# Patient Record
Sex: Female | Born: 1960 | Race: White | Hispanic: No | Marital: Single | Smoking: Never smoker
Health system: Southern US, Community
[De-identification: ages and names within clinical notes are randomized; demographics above are authoritative.]

## PROBLEM LIST (undated history)

## (undated) DIAGNOSIS — D649 Anemia, unspecified: Secondary | ICD-10-CM

## (undated) DIAGNOSIS — F419 Anxiety disorder, unspecified: Secondary | ICD-10-CM

## (undated) DIAGNOSIS — J45909 Unspecified asthma, uncomplicated: Secondary | ICD-10-CM

## (undated) DIAGNOSIS — M1990 Unspecified osteoarthritis, unspecified site: Secondary | ICD-10-CM

## (undated) DIAGNOSIS — J189 Pneumonia, unspecified organism: Secondary | ICD-10-CM

## (undated) DIAGNOSIS — K219 Gastro-esophageal reflux disease without esophagitis: Secondary | ICD-10-CM

## (undated) HISTORY — PX: NASAL SINUS SURGERY: SHX719

## (undated) HISTORY — PX: FOOT SURGERY: SHX648

## (undated) HISTORY — PX: TONSILLECTOMY: SUR1361

## (undated) HISTORY — PX: OTHER SURGICAL HISTORY: SHX169

---

## 1998-06-07 ENCOUNTER — Ambulatory Visit (HOSPITAL_BASED_OUTPATIENT_CLINIC_OR_DEPARTMENT_OTHER): Admission: RE | Admit: 1998-06-07 | Discharge: 1998-06-07 | Payer: Self-pay | Admitting: *Deleted

## 1999-12-31 ENCOUNTER — Encounter: Payer: Self-pay | Admitting: Obstetrics and Gynecology

## 1999-12-31 ENCOUNTER — Encounter: Admission: RE | Admit: 1999-12-31 | Discharge: 1999-12-31 | Payer: Self-pay | Admitting: Obstetrics and Gynecology

## 2001-01-06 ENCOUNTER — Other Ambulatory Visit: Admission: RE | Admit: 2001-01-06 | Discharge: 2001-01-06 | Payer: Self-pay | Admitting: *Deleted

## 2001-12-30 ENCOUNTER — Encounter: Payer: Self-pay | Admitting: Obstetrics and Gynecology

## 2001-12-30 ENCOUNTER — Encounter: Admission: RE | Admit: 2001-12-30 | Discharge: 2001-12-30 | Payer: Self-pay | Admitting: Obstetrics and Gynecology

## 2002-02-21 ENCOUNTER — Other Ambulatory Visit: Admission: RE | Admit: 2002-02-21 | Discharge: 2002-02-21 | Payer: Self-pay | Admitting: Obstetrics and Gynecology

## 2002-02-27 ENCOUNTER — Encounter: Payer: Self-pay | Admitting: Family Medicine

## 2002-02-27 ENCOUNTER — Encounter: Admission: RE | Admit: 2002-02-27 | Discharge: 2002-02-27 | Payer: Self-pay | Admitting: Family Medicine

## 2002-11-16 ENCOUNTER — Encounter: Admission: RE | Admit: 2002-11-16 | Discharge: 2002-11-16 | Payer: Self-pay | Admitting: *Deleted

## 2002-11-16 ENCOUNTER — Encounter: Payer: Self-pay | Admitting: Allergy and Immunology

## 2003-02-26 ENCOUNTER — Other Ambulatory Visit: Admission: RE | Admit: 2003-02-26 | Discharge: 2003-02-26 | Payer: Self-pay | Admitting: Obstetrics and Gynecology

## 2003-02-26 ENCOUNTER — Ambulatory Visit (HOSPITAL_COMMUNITY): Admission: RE | Admit: 2003-02-26 | Discharge: 2003-02-26 | Payer: Self-pay | Admitting: Obstetrics and Gynecology

## 2003-02-26 ENCOUNTER — Encounter: Payer: Self-pay | Admitting: Obstetrics and Gynecology

## 2004-03-19 ENCOUNTER — Ambulatory Visit (HOSPITAL_COMMUNITY): Admission: RE | Admit: 2004-03-19 | Discharge: 2004-03-19 | Payer: Self-pay | Admitting: Obstetrics and Gynecology

## 2004-03-19 ENCOUNTER — Other Ambulatory Visit: Admission: RE | Admit: 2004-03-19 | Discharge: 2004-03-19 | Payer: Self-pay | Admitting: Obstetrics and Gynecology

## 2005-04-07 ENCOUNTER — Ambulatory Visit (HOSPITAL_COMMUNITY): Admission: RE | Admit: 2005-04-07 | Discharge: 2005-04-07 | Payer: Self-pay | Admitting: Obstetrics and Gynecology

## 2005-08-07 ENCOUNTER — Ambulatory Visit (HOSPITAL_COMMUNITY): Admission: RE | Admit: 2005-08-07 | Discharge: 2005-08-07 | Payer: Self-pay | Admitting: *Deleted

## 2005-08-07 ENCOUNTER — Encounter (INDEPENDENT_AMBULATORY_CARE_PROVIDER_SITE_OTHER): Payer: Self-pay | Admitting: Specialist

## 2006-04-13 ENCOUNTER — Ambulatory Visit (HOSPITAL_COMMUNITY): Admission: RE | Admit: 2006-04-13 | Discharge: 2006-04-13 | Payer: Self-pay | Admitting: Obstetrics and Gynecology

## 2006-05-21 ENCOUNTER — Encounter: Admission: RE | Admit: 2006-05-21 | Discharge: 2006-05-21 | Payer: Self-pay | Admitting: Obstetrics and Gynecology

## 2007-04-13 ENCOUNTER — Encounter: Admission: RE | Admit: 2007-04-13 | Discharge: 2007-04-13 | Payer: Self-pay | Admitting: Family Medicine

## 2007-05-23 ENCOUNTER — Ambulatory Visit (HOSPITAL_COMMUNITY): Admission: RE | Admit: 2007-05-23 | Discharge: 2007-05-23 | Payer: Self-pay | Admitting: Obstetrics and Gynecology

## 2008-05-28 ENCOUNTER — Ambulatory Visit (HOSPITAL_COMMUNITY): Admission: RE | Admit: 2008-05-28 | Discharge: 2008-05-28 | Payer: Self-pay | Admitting: Obstetrics and Gynecology

## 2009-06-03 ENCOUNTER — Ambulatory Visit (HOSPITAL_COMMUNITY): Admission: RE | Admit: 2009-06-03 | Discharge: 2009-06-03 | Payer: Self-pay | Admitting: Obstetrics and Gynecology

## 2010-03-05 ENCOUNTER — Encounter: Admission: RE | Admit: 2010-03-05 | Discharge: 2010-03-05 | Payer: Self-pay | Admitting: Family Medicine

## 2010-03-07 ENCOUNTER — Encounter: Admission: RE | Admit: 2010-03-07 | Discharge: 2010-03-07 | Payer: Self-pay | Admitting: Family Medicine

## 2010-06-25 ENCOUNTER — Ambulatory Visit (HOSPITAL_COMMUNITY): Admission: RE | Admit: 2010-06-25 | Discharge: 2010-06-25 | Payer: Self-pay | Admitting: Obstetrics and Gynecology

## 2010-11-16 ENCOUNTER — Encounter: Payer: Self-pay | Admitting: Family Medicine

## 2010-11-16 ENCOUNTER — Encounter: Payer: Self-pay | Admitting: Obstetrics and Gynecology

## 2011-03-13 NOTE — Op Note (Signed)
Yolanda Ochoa, Yolanda Ochoa                ACCOUNT NO.:  [id number]   MEDICAL RECORD NO.:  [id number]          PATIENT TYPE:  AMB   LOCATION:  DAY                          FACILITY:  [facility]   PHYSICIAN:  Vikki Ports, MDDATE OF BIRTH:  05-16-1961   DATE OF PROCEDURE:  08/07/2005  DATE OF DISCHARGE:                                 OPERATIVE REPORT   PREOPERATIVE DIAGNOSIS:  Upper back mass.   POSTOPERATIVE DIAGNOSIS:  Upper back mass, lipoma, 6 cm.   PROCEDURE:  Excision of upper back mass.   SURGEON:  Vikki Ports, MD   ANESTHESIA:  General.   DESCRIPTION OF PROCEDURE:  The patient was taken to the operating room,  placed in a supine position and after adequate general anesthesia was  induced using endotracheal tube, the patient was placed in the prone  position. The upper back was prepped and draped in a normal sterile fashion.  Using a vertical incision over the mass, I dissected down through the  subcutaneous tissue onto a well encapsulated lipoma. It was mobilized and  delivered from the wound. Adequate hemostasis was ensured and the skin was  closed with subcuticular 3-0 Monocryl. Steri-Strips and sterile dressings  were applied. The __________ were injected with 0.5 Marcaine, Steri-Strips  and sterile dressing was applied. The patient tolerated the procedure well  and went to PACU in good condition.      Vikki Ports, MD  Electronically Signed     [facility]/MEDQ  D:  08/07/2005  T:  08/07/2005  Job:  [id number]

## 2011-06-05 ENCOUNTER — Other Ambulatory Visit (HOSPITAL_COMMUNITY): Payer: Self-pay | Admitting: Obstetrics and Gynecology

## 2011-06-05 DIAGNOSIS — Z1231 Encounter for screening mammogram for malignant neoplasm of breast: Secondary | ICD-10-CM

## 2011-06-30 ENCOUNTER — Ambulatory Visit (HOSPITAL_COMMUNITY): Payer: Self-pay

## 2011-07-03 ENCOUNTER — Ambulatory Visit (HOSPITAL_COMMUNITY)
Admission: RE | Admit: 2011-07-03 | Discharge: 2011-07-03 | Disposition: A | Discharge status: Home / Self Care | Payer: BC Managed Care – PPO | Source: Ambulatory Visit | Attending: Obstetrics and Gynecology | Admitting: Obstetrics and Gynecology

## 2011-07-03 DIAGNOSIS — Z1231 Encounter for screening mammogram for malignant neoplasm of breast: Secondary | ICD-10-CM | POA: Insufficient documentation

## 2012-05-17 ENCOUNTER — Other Ambulatory Visit: Payer: Self-pay | Admitting: Gastroenterology

## 2012-07-14 ENCOUNTER — Other Ambulatory Visit (HOSPITAL_COMMUNITY): Payer: Self-pay | Admitting: Obstetrics and Gynecology

## 2012-07-14 DIAGNOSIS — Z1231 Encounter for screening mammogram for malignant neoplasm of breast: Secondary | ICD-10-CM

## 2012-08-09 ENCOUNTER — Ambulatory Visit (HOSPITAL_COMMUNITY)
Admission: RE | Admit: 2012-08-09 | Discharge: 2012-08-09 | Disposition: A | Discharge status: Home / Self Care | Payer: 59 | Source: Ambulatory Visit | Attending: Obstetrics and Gynecology | Admitting: Obstetrics and Gynecology

## 2012-08-09 DIAGNOSIS — Z1231 Encounter for screening mammogram for malignant neoplasm of breast: Secondary | ICD-10-CM | POA: Insufficient documentation

## 2012-08-12 ENCOUNTER — Ambulatory Visit (HOSPITAL_COMMUNITY): Payer: BC Managed Care – PPO

## 2013-07-27 ENCOUNTER — Other Ambulatory Visit (HOSPITAL_COMMUNITY): Payer: Self-pay | Admitting: Obstetrics and Gynecology

## 2013-07-27 DIAGNOSIS — Z1231 Encounter for screening mammogram for malignant neoplasm of breast: Secondary | ICD-10-CM

## 2013-08-21 ENCOUNTER — Ambulatory Visit (HOSPITAL_COMMUNITY)
Admission: RE | Admit: 2013-08-21 | Discharge: 2013-08-21 | Disposition: A | Discharge status: Home / Self Care | Payer: BC Managed Care – PPO | Source: Ambulatory Visit | Attending: Obstetrics and Gynecology | Admitting: Obstetrics and Gynecology

## 2013-08-21 DIAGNOSIS — Z1231 Encounter for screening mammogram for malignant neoplasm of breast: Secondary | ICD-10-CM | POA: Insufficient documentation

## 2014-08-29 ENCOUNTER — Other Ambulatory Visit (HOSPITAL_COMMUNITY): Payer: Self-pay | Admitting: Obstetrics and Gynecology

## 2014-08-29 DIAGNOSIS — Z1231 Encounter for screening mammogram for malignant neoplasm of breast: Secondary | ICD-10-CM

## 2014-09-04 ENCOUNTER — Ambulatory Visit (HOSPITAL_COMMUNITY): Payer: 59

## 2016-04-16 DIAGNOSIS — F411 Generalized anxiety disorder: Secondary | ICD-10-CM | POA: Diagnosis not present

## 2016-04-16 DIAGNOSIS — J4520 Mild intermittent asthma, uncomplicated: Secondary | ICD-10-CM | POA: Diagnosis not present

## 2016-04-16 DIAGNOSIS — E6609 Other obesity due to excess calories: Secondary | ICD-10-CM | POA: Diagnosis not present

## 2016-05-19 DIAGNOSIS — E6609 Other obesity due to excess calories: Secondary | ICD-10-CM | POA: Diagnosis not present

## 2016-06-18 DIAGNOSIS — E6609 Other obesity due to excess calories: Secondary | ICD-10-CM | POA: Diagnosis not present

## 2016-11-05 DIAGNOSIS — Z0000 Encounter for general adult medical examination without abnormal findings: Secondary | ICD-10-CM | POA: Diagnosis not present

## 2016-11-19 DIAGNOSIS — J4520 Mild intermittent asthma, uncomplicated: Secondary | ICD-10-CM | POA: Diagnosis not present

## 2016-11-19 DIAGNOSIS — M1612 Unilateral primary osteoarthritis, left hip: Secondary | ICD-10-CM | POA: Diagnosis not present

## 2016-11-19 DIAGNOSIS — F411 Generalized anxiety disorder: Secondary | ICD-10-CM | POA: Diagnosis not present

## 2016-11-19 DIAGNOSIS — M25552 Pain in left hip: Secondary | ICD-10-CM | POA: Diagnosis not present

## 2016-11-19 DIAGNOSIS — E6609 Other obesity due to excess calories: Secondary | ICD-10-CM | POA: Diagnosis not present

## 2016-11-19 DIAGNOSIS — Z0000 Encounter for general adult medical examination without abnormal findings: Secondary | ICD-10-CM | POA: Diagnosis not present

## 2017-01-04 ENCOUNTER — Ambulatory Visit (INDEPENDENT_AMBULATORY_CARE_PROVIDER_SITE_OTHER): Payer: Self-pay | Admitting: Orthopaedic Surgery

## 2017-01-20 ENCOUNTER — Ambulatory Visit (INDEPENDENT_AMBULATORY_CARE_PROVIDER_SITE_OTHER): Payer: BLUE CROSS/BLUE SHIELD | Admitting: Physician Assistant

## 2017-01-20 ENCOUNTER — Ambulatory Visit (INDEPENDENT_AMBULATORY_CARE_PROVIDER_SITE_OTHER): Payer: BLUE CROSS/BLUE SHIELD

## 2017-01-20 DIAGNOSIS — M25511 Pain in right shoulder: Secondary | ICD-10-CM

## 2017-01-20 DIAGNOSIS — M1612 Unilateral primary osteoarthritis, left hip: Secondary | ICD-10-CM

## 2017-01-20 DIAGNOSIS — M542 Cervicalgia: Secondary | ICD-10-CM | POA: Diagnosis not present

## 2017-01-20 MED ORDER — MELOXICAM 7.5 MG PO TABS: 7.5000 mg | ORAL_TABLET | Freq: Two times a day (BID) | ORAL | 1 refills | Status: DC

## 2017-01-20 NOTE — Progress Notes (Signed)
Office Visit Note   Patient: Yolanda Ochoa           Date of Birth: 1960-10-27           MRN: [id number] Visit Date: 01/20/2017              Requested by: Georgianne Fick, MD [address] [place], [place] [address] PCP: Georgianne Fick, MD   Assessment & Plan: Visit Diagnoses:  1. Acute pain of right shoulder   2. Cervical pain (neck)   3. Primary osteoarthritis of left hip     Plan: We'll have her undergo a intra-articular injection of her left hip with Dr. Alvester Morin in the near future. Regards to the neck will send her to physical therapy for modalities range of motion strengthening home exercise program. Offered her a muscle relaxant she defers. 0.5 mg 1 by mouth twice a day no other NSAIDs while on this. We'll see her back in 1 month check progress lack of.  Follow-Up Instructions: Return in about 4 weeks (around 02/17/2017).   Orders:  Orders Placed This Encounter  Procedures  . XR Shoulder Right  . XR Cervical Spine 2 or 3 views   Meds ordered this encounter  Medications  . meloxicam (MOBIC) 7.5 MG tablet    Sig: Take 1 tablet (7.5 mg total) by mouth 2 (two) times daily.    Dispense:  60 tablet    Refill:  1      Procedures: No procedures performed   Clinical Data: No additional findings.   Subjective: No chief complaint on file.   HPI Ms. Yolanda D9-year-old female with hip right shoulder pain. States she's had left hip for years worse over the past 2 years. She reports that she is done dancing particular ballet life and yoga. No actual injury. She denies any real groin pain. Pains despite around the lateral aspect of the hip and buttocks region. No radicular symptoms down the left leg. She's tried Advil with no real relief. She has pain with prolonged walking. 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 Does not use any assistive devices to ambulate.  Right shoulder pain  for 1 year on and off. Pain with range of motion of her neck. Pain interscapular occasionally has some numbness down the right arm and at times into the right thumb but does not involve the remainder of the fingers. His no symptoms down the left arm. No injury to her neck or to her shoulder.Does report that she's doing a lot more keying  now in her job and is looking at a computer a lot. AP pelvis and lateral view of the left hip are reviewed on canopy show severe end-stage arthritis of the left hip. Flattening of the femoral head and sclerotic changes acetabulum with osteophytes off the acetabulum.  Review of Systems  Constitutional: Negative for chills, fever and unexpected weight change.  Eyes: Negative.   Respiratory: Negative for shortness of breath.   Endocrine: Negative for polydipsia and polyuria.  Musculoskeletal: Positive for back pain, joint swelling and neck pain.  Neurological: Positive for numbness. Negative for dizziness and light-headedness.     Objective: Vital Signs: There were no vitals taken for this visit.  Physical Exam  Constitutional: She is oriented to person, place, and time. She appears well-developed and well-nourished. No distress.  Eyes: EOM are normal.  Cardiovascular: Intact distal pulses.   Pulmonary/Chest: Effort  normal.  Neurological: She is alert and oriented to person, place, and time.  Psychiatric: She has a normal mood and affect. Her behavior is normal.    Ortho Exam Bilateral shoulder she has 5 out of 5 strength with external and internal rotation against resistance. Negative impingement testing bilaterally. Empty can test negative bilaterally. Cross over test is negative bilaterally. Upper chest or she has full sensation to light touch. Radial pulses are 2+ bilaterally full motor of both hands. Cervical spine she has some slight decreased range of motion with rotation of the cervical spine of the right and left. Negative Spurling's. Good flexion and  extension cervical by no tenderness over the cervical spine tenderness in the trapezius region on the right and also over the right medial border of the scapula.  Lower extremities she has diminished internal rotation of the left hip and pain with internal and external rotation of left hip good range of motion of the right hip without pain. Posterior tibial pulses are 2+ bilaterally Supple and Nontender Bilaterally . Specialty Comments:  No specialty comments available.  Imaging: Xr Cervical Spine 2 Or 3 Views  Result Date: 01/20/2017 AP lateral views cervical spine: No acute fractures. No spondylolisthesis. Loss of lordotic curvature. The plate spurring throughout with almost complete bridging at C5-C6 and C6-C7.  Xr Shoulder Right  Result Date: 01/20/2017 Right shoulder 3 views: Shoulder is well located. Minimal acromioclavicular joint arthritic changes. Glenohumeral joint is well maintained. Subacromial space well-maintained.    PMFS History: There are no active problems to display for this patient.  No past medical history on file.  No family history on file.  No past surgical history on file. Social History   Occupational History  . Not on file.   Social History Main Topics  . Smoking status: Not on file  . Smokeless tobacco: Not on file  . Alcohol use Not on file  . Drug use: Unknown  . Sexual activity: Not on file

## 2017-01-27 ENCOUNTER — Ambulatory Visit (INDEPENDENT_AMBULATORY_CARE_PROVIDER_SITE_OTHER): Payer: BLUE CROSS/BLUE SHIELD | Admitting: Physical Medicine and Rehabilitation

## 2017-01-27 ENCOUNTER — Ambulatory Visit (INDEPENDENT_AMBULATORY_CARE_PROVIDER_SITE_OTHER): Payer: BLUE CROSS/BLUE SHIELD

## 2017-01-27 ENCOUNTER — Encounter (INDEPENDENT_AMBULATORY_CARE_PROVIDER_SITE_OTHER): Payer: Self-pay | Admitting: Physical Medicine and Rehabilitation

## 2017-01-27 VITALS — BP 127/80

## 2017-01-27 DIAGNOSIS — M25552 Pain in left hip: Secondary | ICD-10-CM | POA: Diagnosis not present

## 2017-01-27 NOTE — Patient Instructions (Signed)

## 2017-01-27 NOTE — Progress Notes (Signed)
   Yolanda Ochoa - 56 y.o. female MRN [id number]  Date of birth: February 15, 1961  Office Visit Note: Visit Date: 01/27/2017 PCP: Georgianne Fick, MD Referred by: Georgianne Fick, MD  Subjective: Chief Complaint  Patient presents with  . Left Hip - Pain   HPI: Yolanda Ochoa is a pleasant and active 56 year old female with chronic worsening severe left hip pain for 2 years. Constant pain. Worse with walking and twisting. Denies groin pain but does get pain into the superior anterior lateral area of the hip particularly with rotation of the hip.    ROS Otherwise per HPI.  Assessment & Plan: Visit Diagnoses:  1. Pain in left hip     Plan: Findings:  Left hip intra-articular joint anesthetic arthrogram. She did get good relief during the anesthetic phase.    Meds & Orders: No orders of the defined types were placed in this encounter.   Orders Placed This Encounter  Procedures  . Large Joint Injection/Arthrocentesis  . XR C-ARM NO REPORT    Follow-up: Return for Dr. Magnus Ivan scheduled follow up.   Procedures: Hip anesthetic arthrogram Date/Time: 01/27/2017 9:57 AM Performed by: Tyrell Antonio Authorized by: Tyrell Antonio   Consent Given by:  Patient Site marked: the procedure site was marked   Timeout: prior to procedure the correct patient, procedure, and site was verified   Indications:  Pain and diagnostic evaluation Location:  Hip Site:  L hip joint Prep: patient was prepped and draped in usual sterile fashion   Needle Size:  22 G Approach:  Anterior Ultrasound Guidance: No   Fluoroscopic Guidance: No   Arthrogram: Yes   Medications:  80 mg triamcinolone acetonide 40 MG/ML; 3 mL bupivacaine 0.5 % Aspiration Attempted: Yes   Patient tolerance:  Patient tolerated the procedure well with no immediate complications  Arthrogram demonstrated excellent flow of contrast throughout the joint surface without extravasation or obvious defect.  The patient had relief of  symptoms during the anesthetic phase of the injection.      No notes on file   Clinical History: No specialty comments available.  She reports that she has never smoked. She has never used smokeless tobacco. No results for input(s): HGBA1C, LABURIC in the last 8760 hours.  Objective:  VS:  HT:    WT:   BMI:     BP:127/80  HR: bpm  TEMP: ( )  RESP:  Physical Exam  Musculoskeletal:  The patient is slow to rise from a seated position and she does ambulate without aid. She does have concordant pain with hip rotation on the left.    Ortho Exam Imaging: Xr C-arm No Report  Result Date: 01/27/2017 Please see Notes or Procedures tab for imaging impression.   Past Medical/Family/Surgical/Social History: Medications & Allergies reviewed per EMR There are no active problems to display for this patient.  History reviewed. No pertinent past medical history. History reviewed. No pertinent family history. History reviewed. No pertinent surgical history. Social History   Occupational History  . Not on file.   Social History Main Topics  . Smoking status: Never Smoker  . Smokeless tobacco: Never Used  . Alcohol use Not on file  . Drug use: Unknown  . Sexual activity: Not on file

## 2017-01-28 MED ORDER — BUPIVACAINE HCL 0.5 % IJ SOLN
3.0000 mL | INTRAMUSCULAR | Status: AC | PRN
  Administered 2017-01-27: 3 mL via INTRA_ARTICULAR

## 2017-01-28 MED ORDER — TRIAMCINOLONE ACETONIDE 40 MG/ML IJ SUSP
80.0000 mg | INTRAMUSCULAR | Status: AC | PRN
  Administered 2017-01-27: 80 mg via INTRA_ARTICULAR

## 2017-02-02 DIAGNOSIS — M25511 Pain in right shoulder: Secondary | ICD-10-CM | POA: Diagnosis not present

## 2017-02-02 DIAGNOSIS — M6281 Muscle weakness (generalized): Secondary | ICD-10-CM | POA: Diagnosis not present

## 2017-02-02 DIAGNOSIS — R293 Abnormal posture: Secondary | ICD-10-CM | POA: Diagnosis not present

## 2017-02-02 DIAGNOSIS — M542 Cervicalgia: Secondary | ICD-10-CM | POA: Diagnosis not present

## 2017-02-15 DIAGNOSIS — M25511 Pain in right shoulder: Secondary | ICD-10-CM | POA: Diagnosis not present

## 2017-02-15 DIAGNOSIS — M542 Cervicalgia: Secondary | ICD-10-CM | POA: Diagnosis not present

## 2017-02-15 DIAGNOSIS — M6281 Muscle weakness (generalized): Secondary | ICD-10-CM | POA: Diagnosis not present

## 2017-02-15 DIAGNOSIS — R293 Abnormal posture: Secondary | ICD-10-CM | POA: Diagnosis not present

## 2017-02-22 ENCOUNTER — Encounter (INDEPENDENT_AMBULATORY_CARE_PROVIDER_SITE_OTHER): Payer: Self-pay

## 2017-02-22 ENCOUNTER — Ambulatory Visit (INDEPENDENT_AMBULATORY_CARE_PROVIDER_SITE_OTHER): Payer: BLUE CROSS/BLUE SHIELD | Admitting: Orthopaedic Surgery

## 2017-02-22 DIAGNOSIS — M1612 Unilateral primary osteoarthritis, left hip: Secondary | ICD-10-CM | POA: Diagnosis not present

## 2017-02-22 DIAGNOSIS — M25552 Pain in left hip: Secondary | ICD-10-CM | POA: Diagnosis not present

## 2017-02-22 NOTE — Progress Notes (Signed)
The patient is mainly coming in today for follow-up of her left hip. She has known osteoarthritis in that left hip which is quite severe. This correlates with her physical exam and her x-rays. Dr. Alvester Morin here in the office provided a fluoroscopically guided intra-articular injection of a steroid in that left hip earlier this month. She said that hip injection helped greatly. She is on meloxicam as well asTumeric. She says is taken the edge off enough that is helping her do her daily activities and her job. She does want to consider hip replacement surgery in the future but like to even consider one more steroid injection down the road.  On examination of her hip she has pain on extremes of internal rotation rotation of her left hip but tolerating it okay. Her right shoulder and neck has improved as well.  At this point if he gets close to considering another steroid injection which I told her to wait at least 4-5 months she can call the office and we would get Dr. Alvester Morin to provide again her left hip.

## 2017-02-23 DIAGNOSIS — M6281 Muscle weakness (generalized): Secondary | ICD-10-CM | POA: Diagnosis not present

## 2017-02-23 DIAGNOSIS — R293 Abnormal posture: Secondary | ICD-10-CM | POA: Diagnosis not present

## 2017-02-23 DIAGNOSIS — M542 Cervicalgia: Secondary | ICD-10-CM | POA: Diagnosis not present

## 2017-02-23 DIAGNOSIS — M25511 Pain in right shoulder: Secondary | ICD-10-CM | POA: Diagnosis not present

## 2017-05-01 ENCOUNTER — Other Ambulatory Visit (INDEPENDENT_AMBULATORY_CARE_PROVIDER_SITE_OTHER): Payer: Self-pay | Admitting: Orthopaedic Surgery

## 2017-06-17 DIAGNOSIS — J4520 Mild intermittent asthma, uncomplicated: Secondary | ICD-10-CM | POA: Diagnosis not present

## 2017-06-17 DIAGNOSIS — F411 Generalized anxiety disorder: Secondary | ICD-10-CM | POA: Diagnosis not present

## 2017-06-17 DIAGNOSIS — M25551 Pain in right hip: Secondary | ICD-10-CM | POA: Diagnosis not present

## 2017-07-05 ENCOUNTER — Encounter (INDEPENDENT_AMBULATORY_CARE_PROVIDER_SITE_OTHER): Payer: Self-pay | Admitting: Physician Assistant

## 2017-07-05 ENCOUNTER — Ambulatory Visit (INDEPENDENT_AMBULATORY_CARE_PROVIDER_SITE_OTHER): Payer: BLUE CROSS/BLUE SHIELD

## 2017-07-05 ENCOUNTER — Ambulatory Visit (INDEPENDENT_AMBULATORY_CARE_PROVIDER_SITE_OTHER): Payer: BLUE CROSS/BLUE SHIELD | Admitting: Physician Assistant

## 2017-07-05 DIAGNOSIS — M1612 Unilateral primary osteoarthritis, left hip: Secondary | ICD-10-CM | POA: Diagnosis not present

## 2017-07-05 DIAGNOSIS — M1611 Unilateral primary osteoarthritis, right hip: Secondary | ICD-10-CM

## 2017-07-05 MED ORDER — TRIAMCINOLONE ACETONIDE 40 MG/ML IJ SUSP
80.0000 mg | INTRAMUSCULAR | Status: AC | PRN
  Administered 2017-07-05: 80 mg via INTRA_ARTICULAR

## 2017-07-05 MED ORDER — BUPIVACAINE HCL 0.5 % IJ SOLN
3.0000 mL | INTRAMUSCULAR | Status: AC | PRN
Start: 2017-07-05 — End: 2017-07-05
  Administered 2017-07-05: 3 mL via INTRA_ARTICULAR

## 2017-07-05 MED ORDER — TRAMADOL HCL 50 MG PO TABS: ORAL_TABLET | ORAL | 0 refills | Status: DC

## 2017-07-05 NOTE — Progress Notes (Signed)
Office Visit Note   Patient: Yolanda Ochoa           Date of Birth: 09/24/1961           MRN: [id number] Visit Date: 07/05/2017              Requested by: Georgianne Fickamachandran, Ajith, MD [address] [place], [place] [id number] PCP: Georgianne Fickamachandran, Ajith, MD   Assessment & Plan: Visit Diagnoses:  1. Primary osteoarthritis of right hip   2. Primary osteoarthritis of left hip     Plan: We will schedule her for a left total hip arthroplasty in the near future. She's failed conservative treatment which is included time, oral medications and intra-articular injections of the left hip. Despite these she has severe pain in her hip that affects her activities of daily living. Discussed with her risks which include nerve or vessel injury, wound healing problems, PE DVT, prolonged pain and worsening pain. Questions encouraged and answered at length today. Handout on hip replacements as given. A hip model was shown that the patient discussed at length. Postoperative protocol discussed with patient. Also discussed with her a right hip intra-articular injection which we will have her undergo today with Dr. Alvester MorinNewton.  Follow-Up Instructions: Return in about 2 weeks (around 07/19/2017) for post op 2 weeks.   Orders:  Orders Placed This Encounter  Procedures  . Large Joint Injection/Arthrocentesis  . XR HIP UNILAT W OR W/O PELVIS 2-3 VIEWS RIGHT  . XR C-ARM NO REPORT   Meds ordered this encounter  Medications  . traMADol (ULTRAM) 50 MG tablet    Sig: Take one tablet every 6 hours as needed for pain, not to exceed 4 a day.    Dispense:  40 tablet    Refill:  0      Procedures: Large Joint Inj Date/Time: 07/05/2017 11:17 AM Performed by: Tyrell AntonioNEWTON, FREDERIC Authorized by: Tyrell AntonioNEWTON, FREDERIC   Consent Given by:  Patient Site marked: the procedure site was marked   Timeout: prior to procedure the correct patient, procedure, and site was verified   Indications:  Pain and diagnostic  evaluation Location:  Hip Site:  R hip joint Prep: patient was prepped and draped in usual sterile fashion   Needle Size:  22 G Needle Length:  3.5 inches Approach:  Anterior Ultrasound Guidance: No   Fluoroscopic Guidance: Yes   Arthrogram: No   Medications:  80 mg triamcinolone acetonide 40 MG/ML; 3 mL bupivacaine 0.5 % Aspiration Attempted: Yes   Patient tolerance:  Patient tolerated the procedure well with no immediate complications  There was excellent flow of contrast producing a partial arthrogram of the hip. The patient did have relief of symptoms during the anesthetic phase of the injection.     Clinical Data: No additional findings.   Subjective: Chief Complaint  Patient presents with  . Right Hip - Pain    HPI Mrs. Vial was in today with bilateral hip pain right greater than left. She's had an intra-articular injections the left hip in the past but can tell that her left hip pain is coming back. Currently her right hips bothering her most. She says about a intra-articular injection in the right hip. She has osteoarthritis of both hips. She does take Mobic and Tumeric or hip pain. She does use a cane to ambulate at times. She states that her pain is becoming more more severe in both hips. She is asking about a left hip replacement near future. Said no history  of PE DVT.  Review of Systems No chest pain shortness breath fevers chills nausea vomiting.  Objective: Vital Signs: There were no vitals taken for this visit.  Physical Exam  Constitutional: She is oriented to person, place, and time. She appears well-developed and well-nourished. No distress.  Pulmonary/Chest: Effort normal.  Neurological: She is alert and oriented to person, place, and time.  Skin: She is not diaphoretic.  Psychiatric: She has a normal mood and affect.    Ortho Exam Bilateral hip she has good external rotation. Limited internal rotation with pain of both hips right greater than left  currently. Specialty Comments:  No specialty comments available.  Imaging: Xr Hip Unilat W Or W/o Pelvis 2-3 Views Right  Result Date: 07/05/2017 AP pelvis and bilateral lateral hips: No acute fracture. Hips are well located. Left hip with bone-on-bone arthritis in the spurring about the femoral head. Right hip with near bone-on-bone arthritis.    PMFS History: Patient Active Problem List   Diagnosis Date Noted  . Unilateral primary osteoarthritis, left hip 02/22/2017  . Pain of left hip joint 02/22/2017   No past medical history on file.  No family history on file.  No past surgical history on file. Social History   Occupational History  . Not on file.   Social History Main Topics  . Smoking status: Never Smoker  . Smokeless tobacco: Never Used  . Alcohol use Not on file  . Drug use: Unknown  . Sexual activity: Not on file

## 2017-07-05 NOTE — Patient Instructions (Signed)

## 2017-07-06 ENCOUNTER — Telehealth (INDEPENDENT_AMBULATORY_CARE_PROVIDER_SITE_OTHER): Payer: Self-pay | Admitting: Radiology

## 2017-07-06 NOTE — Telephone Encounter (Signed)
Patient left voicemail advising she wants to change her surgery date to October 08, 2017.  She is scheduled for October 15, 2017 at this moment.

## 2017-07-08 NOTE — Telephone Encounter (Signed)
I called patient and rescheduled.

## 2017-07-14 ENCOUNTER — Telehealth (INDEPENDENT_AMBULATORY_CARE_PROVIDER_SITE_OTHER): Payer: Self-pay | Admitting: Orthopaedic Surgery

## 2017-07-14 NOTE — Telephone Encounter (Signed)
Please advise 

## 2017-07-14 NOTE — Telephone Encounter (Signed)
Pt wants to go ahead and schedule surgery Rt Hip

## 2017-07-14 NOTE — Telephone Encounter (Signed)
I will fill out a surgical scheduling sheet and get it to [facility].

## 2017-07-14 NOTE — Telephone Encounter (Signed)
To you 

## 2017-07-26 ENCOUNTER — Encounter (INDEPENDENT_AMBULATORY_CARE_PROVIDER_SITE_OTHER): Payer: BLUE CROSS/BLUE SHIELD | Admitting: Physical Medicine and Rehabilitation

## 2017-07-27 NOTE — Progress Notes (Signed)
Please place orders in EPIC as patient is being scheduled for a pre-op appointment! Thank you! 

## 2017-07-27 NOTE — Telephone Encounter (Signed)
I spoke with patient and scheduled surgery for right THA.

## 2017-07-28 ENCOUNTER — Telehealth (INDEPENDENT_AMBULATORY_CARE_PROVIDER_SITE_OTHER): Payer: Self-pay | Admitting: Physician Assistant

## 2017-07-28 ENCOUNTER — Other Ambulatory Visit (INDEPENDENT_AMBULATORY_CARE_PROVIDER_SITE_OTHER): Payer: Self-pay | Admitting: Physician Assistant

## 2017-07-28 ENCOUNTER — Other Ambulatory Visit (INDEPENDENT_AMBULATORY_CARE_PROVIDER_SITE_OTHER): Payer: Self-pay

## 2017-07-28 MED ORDER — TRAMADOL HCL 50 MG PO TABS: ORAL_TABLET | ORAL | 0 refills | Status: DC

## 2017-07-28 NOTE — Telephone Encounter (Signed)
Patient called needing Rx refilled (Tramadol) Patient asked if the Rx can be called into the [facility] in [place] on [address]   The ph# is [phone]   The number to contact patient is [phone] Patient advised she is out of town taking care of her father.

## 2017-07-28 NOTE — Telephone Encounter (Signed)
Please advise 

## 2017-07-28 NOTE — Telephone Encounter (Signed)
It is okay to call the tramadol for her. 1-2 every 6-8 hours as needed #60 no refills.

## 2017-07-28 NOTE — Telephone Encounter (Signed)
Called into pharmacy

## 2017-08-01 ENCOUNTER — Other Ambulatory Visit (INDEPENDENT_AMBULATORY_CARE_PROVIDER_SITE_OTHER): Payer: Self-pay | Admitting: Orthopaedic Surgery

## 2017-08-02 NOTE — Patient Instructions (Addendum)
GISSEL KEILMAN  08/02/2017   Your procedure is scheduled on: 08/13/17  Report to [facility] Main  Entrance   Report to admitting at  1000 AM   Call this number if you have problems the morning of surgery  [phone]   Remember: ONLY 1 PERSON MAY GO WITH YOU TO SHORT STAY TO GET  READY MORNING OF YOUR SURGERY.  Do not eat food or drink liquids :After Midnight.     Take these medicines the morning of surgery with A SIP OF WATER: inhalers as needed and bring, flonase if              needed, wellbutrin                                You may not have any metal on your body including hair pins and              piercings  Do not wear jewelry, make-up, lotions, powders or perfumes, deodorant             Do not wear nail polish.  Do not shave  48 hours prior to surgery.             Do not bring valuables to the hospital. [facility] IS NOT             RESPONSIBLE   FOR VALUABLES.  Contacts, dentures or bridgework may not be worn into surgery.  Leave suitcase in the car. After surgery it may be brought to your room.               Please read over the following fact sheets you were given: _____________________________________________________________________         [facility] - Preparing for Surgery Before surgery, you can play an important role.  Because skin is not sterile, your skin needs to be as free of germs as possible.  You can reduce the number of germs on your skin by washing with CHG (chlorahexidine gluconate) soap before surgery.  CHG is an antiseptic cleaner which kills germs and bonds with the skin to continue killing germs even after washing. Please DO NOT use if you have an allergy to CHG or antibacterial soaps.  If your skin becomes reddened/irritated stop using the CHG and inform your nurse when you arrive at Short Stay. Do not shave (including legs and underarms) for at least 48 hours prior to the first CHG shower.  You may shave your  face/neck. Please follow these instructions carefully:  1.  Shower with CHG Soap the night before surgery and the  morning of Surgery.  2.  If you choose to wash your hair, wash your hair first as usual with your  normal  shampoo.  3.  After you shampoo, rinse your hair and body thoroughly to remove the  shampoo.                           4.  Use CHG as you would any other liquid soap.  You can apply chg directly  to the skin and wash                       Gently with a scrungie or clean washcloth.  5.  Apply the  CHG Soap to your body ONLY FROM THE NECK DOWN.   Do not use on face/ open                           Wound or open sores. Avoid contact with eyes, ears mouth and genitals (private parts).                       Wash face,  Genitals (private parts) with your normal soap.             6.  Wash thoroughly, paying special attention to the area where your surgery  will be performed.  7.  Thoroughly rinse your body with warm water from the neck down.  8.  DO NOT shower/wash with your normal soap after using and rinsing off  the CHG Soap.                9.  Pat yourself dry with a clean towel.            10.  Wear clean pajamas.            11.  Place clean sheets on your bed the night of your first shower and do not  sleep with pets. Day of Surgery : Do not apply any lotions/deodorants the morning of surgery.  Please wear clean clothes to the hospital/surgery center.  FAILURE TO FOLLOW THESE INSTRUCTIONS MAY RESULT IN THE CANCELLATION OF YOUR SURGERY PATIENT SIGNATURE_________________________________  NURSE SIGNATURE__________________________________  ________________________________________________________________________  WHAT IS A BLOOD TRANSFUSION? Blood Transfusion Information  A transfusion is the replacement of blood or some of its parts. Blood is made up of multiple cells which provide different functions.  Red blood cells carry oxygen and are used for blood loss  replacement.  White blood cells fight against infection.  Platelets control bleeding.  Plasma helps clot blood.  Other blood products are available for specialized needs, such as hemophilia or other clotting disorders. BEFORE THE TRANSFUSION  Who gives blood for transfusions?   Healthy volunteers who are fully evaluated to make sure their blood is safe. This is blood bank blood. Transfusion therapy is the safest it has ever been in the practice of medicine. Before blood is taken from a donor, a complete history is taken to make sure that person has no history of diseases nor engages in risky social behavior (examples are intravenous drug use or sexual activity with multiple partners). The donor's travel history is screened to minimize risk of transmitting infections, such as malaria. The donated blood is tested for signs of infectious diseases, such as HIV and hepatitis. The blood is then tested to be sure it is compatible with you in order to minimize the chance of a transfusion reaction. If you or a relative donates blood, this is often done in anticipation of surgery and is not appropriate for emergency situations. It takes many days to process the donated blood. RISKS AND COMPLICATIONS Although transfusion therapy is very safe and saves many lives, the main dangers of transfusion include:   Getting an infectious disease.  Developing a transfusion reaction. This is an allergic reaction to something in the blood you were given. Every precaution is taken to prevent this. The decision to have a blood transfusion has been considered carefully by your caregiver before blood is given. Blood is not given unless the benefits outweigh the risks. AFTER THE TRANSFUSION  Right after receiving a blood transfusion,  you will usually feel much better and more energetic. This is especially true if your red blood cells have gotten low (anemic). The transfusion raises the level of the red blood cells which  carry oxygen, and this usually causes an energy increase.  The nurse administering the transfusion will monitor you carefully for complications. HOME CARE INSTRUCTIONS  No special instructions are needed after a transfusion. You may find your energy is better. Speak with your caregiver about any limitations on activity for underlying diseases you may have. SEEK MEDICAL CARE IF:   Your condition is not improving after your transfusion.  You develop redness or irritation at the intravenous (IV) site. SEEK IMMEDIATE MEDICAL CARE IF:  Any of the following symptoms occur over the next 12 hours:  Shaking chills.  You have a temperature by mouth above 102 F (38.9 C), not controlled by medicine.  Chest, back, or muscle pain.  People around you feel you are not acting correctly or are confused.  Shortness of breath or difficulty breathing.  Dizziness and fainting.  You get a rash or develop hives.  You have a decrease in urine output.  Your urine turns a dark color or changes to pink, red, or brown. Any of the following symptoms occur over the next 10 days:  You have a temperature by mouth above 102 F (38.9 C), not controlled by medicine.  Shortness of breath.  Weakness after normal activity.  The white part of the eye turns yellow (jaundice).  You have a decrease in the amount of urine or are urinating less often.  Your urine turns a dark color or changes to pink, red, or brown. Document Released: 10/09/2000 Document Revised: 01/04/2012 Document Reviewed: 05/28/2008 Exit[employer] Patient Information 2014 [place].  _______________________________________________________________________  Incentive Spirometer  An incentive spirometer is a tool that can help keep your lungs clear and active. This tool measures how well you are filling your lungs with each breath. Taking long deep breaths may help reverse or decrease the chance of developing breathing (pulmonary) problems  (especially infection) following:  A long period of time when you are unable to move or be active. BEFORE THE PROCEDURE   If the spirometer includes an indicator to show your best effort, your nurse or respiratory therapist will set it to a desired goal.  If possible, sit up straight or lean slightly forward. Try not to slouch.  Hold the incentive spirometer in an upright position. INSTRUCTIONS FOR USE  1. Sit on the edge of your bed if possible, or sit up as far as you can in bed or on a chair. 2. Hold the incentive spirometer in an upright position. 3. Breathe out normally. 4. Place the mouthpiece in your mouth and seal your lips tightly around it. 5. Breathe in slowly and as deeply as possible, raising the piston or the ball toward the top of the column. 6. Hold your breath for 3-5 seconds or for as long as possible. Allow the piston or ball to fall to the bottom of the column. 7. Remove the mouthpiece from your mouth and breathe out normally. 8. Rest for a few seconds and repeat Steps 1 through 7 at least 10 times every 1-2 hours when you are awake. Take your time and take a few normal breaths between deep breaths. 9. The spirometer may include an indicator to show your best effort. Use the indicator as a goal to work toward during each repetition. 10. After each set of 10 deep breaths,  practice coughing to be sure your lungs are clear. If you have an incision (the cut made at the time of surgery), support your incision when coughing by placing a pillow or rolled up towels firmly against it. Once you are able to get out of bed, walk around indoors and cough well. You may stop using the incentive spirometer when instructed by your caregiver.  RISKS AND COMPLICATIONS  Take your time so you do not get dizzy or light-headed.  If you are in pain, you may need to take or ask for pain medication before doing incentive spirometry. It is harder to take a deep breath if you are having  pain. AFTER USE  Rest and breathe slowly and easily.  It can be helpful to keep track of a log of your progress. Your caregiver can provide you with a simple table to help with this. If you are using the spirometer at home, follow these instructions: [place] IF:   You are having difficultly using the spirometer.  You have trouble using the spirometer as often as instructed.  Your pain medication is not giving enough relief while using the spirometer.  You develop fever of 100.5 F (38.1 C) or higher. SEEK IMMEDIATE MEDICAL CARE IF:   You cough up bloody sputum that had not been present before.  You develop fever of 102 F (38.9 C) or greater.  You develop worsening pain at or near the incision site. MAKE SURE YOU:   Understand these instructions.  Will watch your condition.  Will get help right away if you are not doing well or get worse. Document Released: 02/22/2007 Document Revised: 01/04/2012 Document Reviewed: 04/25/2007 [facility] Patient Information 2014 [place], [place].   ________________________________________________________________________

## 2017-08-06 ENCOUNTER — Encounter (HOSPITAL_COMMUNITY): Payer: Self-pay

## 2017-08-06 ENCOUNTER — Telehealth (INDEPENDENT_AMBULATORY_CARE_PROVIDER_SITE_OTHER): Payer: Self-pay

## 2017-08-06 ENCOUNTER — Encounter (HOSPITAL_COMMUNITY)
Admission: RE | Admit: 2017-08-06 | Discharge: 2017-08-06 | Disposition: A | Discharge status: Home / Self Care | Payer: BLUE CROSS/BLUE SHIELD | Source: Ambulatory Visit | Attending: Orthopaedic Surgery | Admitting: Orthopaedic Surgery

## 2017-08-06 DIAGNOSIS — Z01818 Encounter for other preprocedural examination: Secondary | ICD-10-CM | POA: Diagnosis not present

## 2017-08-06 DIAGNOSIS — M1611 Unilateral primary osteoarthritis, right hip: Secondary | ICD-10-CM | POA: Insufficient documentation

## 2017-08-06 HISTORY — DX: Anxiety disorder, unspecified: F41.9

## 2017-08-06 HISTORY — DX: Unspecified asthma, uncomplicated: J45.909

## 2017-08-06 HISTORY — DX: Gastro-esophageal reflux disease without esophagitis: K21.9

## 2017-08-06 HISTORY — DX: Anemia, unspecified: D64.9

## 2017-08-06 HISTORY — DX: Pneumonia, unspecified organism: J18.9

## 2017-08-06 HISTORY — DX: Unspecified osteoarthritis, unspecified site: M19.90

## 2017-08-06 LAB — CBC
HCT: 38.7 % (ref 36.0–46.0)
HEMOGLOBIN: 12.6 g/dL (ref 12.0–15.0)
MCH: 30.9 pg (ref 26.0–34.0)
MCHC: 32.6 g/dL (ref 30.0–36.0)
MCV: 94.9 fL (ref 78.0–100.0)
PLATELETS: 375 10*3/uL (ref 150–400)
RBC: 4.08 MIL/uL (ref 3.87–5.11)
RDW: 12.7 % (ref 11.5–15.5)
WBC: 10.5 10*3/uL (ref 4.0–10.5)

## 2017-08-06 LAB — SURGICAL PCR SCREEN
MRSA, PCR: NEGATIVE
Staphylococcus aureus: POSITIVE — AB

## 2017-08-06 LAB — ABO/RH: ABO/RH(D): A POS

## 2017-08-06 NOTE — Telephone Encounter (Signed)
Patient would like to know what medications she need to stop taking before she has surgery on Friday, 08/13/17 with Dr. Magnus Ivan.  Currently taking Ibuprofen, Aleve, and Advil.  CB# is [phone].  Thank You.

## 2017-08-09 ENCOUNTER — Other Ambulatory Visit (INDEPENDENT_AMBULATORY_CARE_PROVIDER_SITE_OTHER): Payer: Self-pay | Admitting: Orthopaedic Surgery

## 2017-08-09 MED ORDER — TIZANIDINE HCL 4 MG PO TABS: 4.0000 mg | ORAL_TABLET | Freq: Three times a day (TID) | ORAL | 0 refills | Status: DC | PRN

## 2017-08-09 MED ORDER — OXYCODONE-ACETAMINOPHEN 5-325 MG PO TABS: 1.0000 | ORAL_TABLET | ORAL | 0 refills | Status: DC | PRN

## 2017-08-09 NOTE — Telephone Encounter (Signed)
Patient wants the Rx's that she will be given AFTER surgery for pain/muscle relaxer/etc given to her now, she wants to go ahead and get this all done

## 2017-08-09 NOTE — Telephone Encounter (Signed)
She can come and pick up prescription for Percocet for the pain medication postoperative. We'll send in the muscle relaxant to her pharmacy.

## 2017-08-09 NOTE — Telephone Encounter (Signed)
Patient aware Rx ready at the front desk  

## 2017-08-11 ENCOUNTER — Other Ambulatory Visit (INDEPENDENT_AMBULATORY_CARE_PROVIDER_SITE_OTHER): Payer: Self-pay

## 2017-08-12 ENCOUNTER — Telehealth (INDEPENDENT_AMBULATORY_CARE_PROVIDER_SITE_OTHER): Payer: Self-pay | Admitting: Orthopaedic Surgery

## 2017-08-12 NOTE — Telephone Encounter (Signed)
Patient aware of the below message  

## 2017-08-12 NOTE — Telephone Encounter (Signed)
That will be fine for her to take that medication with just a sip.

## 2017-08-12 NOTE — Telephone Encounter (Signed)
Patient called asked if she can take Tylenol and Tramadol after midnight with a sip of water. The number to contact patient is [phone]

## 2017-08-12 NOTE — Telephone Encounter (Signed)
Advise

## 2017-08-13 ENCOUNTER — Inpatient Hospital Stay (HOSPITAL_COMMUNITY): Payer: BLUE CROSS/BLUE SHIELD

## 2017-08-13 ENCOUNTER — Encounter (HOSPITAL_COMMUNITY): Payer: Self-pay | Admitting: *Deleted

## 2017-08-13 ENCOUNTER — Inpatient Hospital Stay (HOSPITAL_COMMUNITY)
Admission: RE | Admit: 2017-08-13 | Discharge: 2017-08-15 | DRG: 470 | Disposition: A | Discharge status: Home Health | Payer: BLUE CROSS/BLUE SHIELD | Source: Ambulatory Visit | Attending: Orthopaedic Surgery | Admitting: Orthopaedic Surgery

## 2017-08-13 ENCOUNTER — Inpatient Hospital Stay (HOSPITAL_COMMUNITY): Payer: BLUE CROSS/BLUE SHIELD | Admitting: Anesthesiology

## 2017-08-13 ENCOUNTER — Encounter (HOSPITAL_COMMUNITY)
Admission: RE | Disposition: A | Discharge status: Home Health | Payer: Self-pay | Source: Ambulatory Visit | Attending: Orthopaedic Surgery

## 2017-08-13 DIAGNOSIS — Z885 Allergy status to narcotic agent status: Secondary | ICD-10-CM | POA: Diagnosis not present

## 2017-08-13 DIAGNOSIS — Z471 Aftercare following joint replacement surgery: Secondary | ICD-10-CM | POA: Diagnosis not present

## 2017-08-13 DIAGNOSIS — Z419 Encounter for procedure for purposes other than remedying health state, unspecified: Secondary | ICD-10-CM

## 2017-08-13 DIAGNOSIS — Z880 Allergy status to penicillin: Secondary | ICD-10-CM

## 2017-08-13 DIAGNOSIS — Z96641 Presence of right artificial hip joint: Secondary | ICD-10-CM

## 2017-08-13 DIAGNOSIS — M659 Synovitis and tenosynovitis, unspecified: Secondary | ICD-10-CM | POA: Diagnosis not present

## 2017-08-13 DIAGNOSIS — M1611 Unilateral primary osteoarthritis, right hip: Secondary | ICD-10-CM

## 2017-08-13 HISTORY — PX: TOTAL HIP ARTHROPLASTY: SHX124

## 2017-08-13 LAB — TYPE AND SCREEN
ABO/RH(D): A POS
Antibody Screen: NEGATIVE

## 2017-08-13 SURGERY — ARTHROPLASTY, HIP, TOTAL, ANTERIOR APPROACH
Anesthesia: Spinal | Site: Hip | Laterality: Right

## 2017-08-13 MED ORDER — POLYETHYLENE GLYCOL 3350 17 G PO PACK: 17.0000 g | PACK | Freq: Every day | ORAL | Status: DC | PRN

## 2017-08-13 MED ORDER — POVIDONE-IODINE 10 % EX SOLN
Freq: Once | CUTANEOUS | Status: AC
  Administered 2017-08-13: 1 via TOPICAL
  Filled 2017-08-13: qty 118

## 2017-08-13 MED ORDER — DEXTROSE 5 % IV SOLN
500.0000 mg | Freq: Four times a day (QID) | INTRAVENOUS | Status: DC | PRN
  Administered 2017-08-13: 500 mg via INTRAVENOUS
  Filled 2017-08-13: qty 550

## 2017-08-13 MED ORDER — METHOCARBAMOL 500 MG PO TABS
500.0000 mg | ORAL_TABLET | Freq: Four times a day (QID) | ORAL | Status: DC | PRN
  Administered 2017-08-14 – 2017-08-15 (×5): 500 mg via ORAL
  Filled 2017-08-13 (×5): qty 1

## 2017-08-13 MED ORDER — MENTHOL 3 MG MT LOZG: 1.0000 | LOZENGE | OROMUCOSAL | Status: DC | PRN

## 2017-08-13 MED ORDER — PHENYLEPHRINE 40 MCG/ML (10ML) SYRINGE FOR IV PUSH (FOR BLOOD PRESSURE SUPPORT)
PREFILLED_SYRINGE | INTRAVENOUS | Status: AC
  Filled 2017-08-13: qty 10

## 2017-08-13 MED ORDER — FENTANYL CITRATE (PF) 100 MCG/2ML IJ SOLN
INTRAMUSCULAR | Status: AC
  Filled 2017-08-13: qty 2

## 2017-08-13 MED ORDER — PROPOFOL 10 MG/ML IV BOLUS
INTRAVENOUS | Status: AC
  Filled 2017-08-13: qty 20

## 2017-08-13 MED ORDER — PROMETHAZINE HCL 25 MG/ML IJ SOLN: 6.2500 mg | INTRAMUSCULAR | Status: DC | PRN

## 2017-08-13 MED ORDER — ONDANSETRON HCL 4 MG/2ML IJ SOLN: 4.0000 mg | Freq: Four times a day (QID) | INTRAMUSCULAR | Status: DC | PRN

## 2017-08-13 MED ORDER — ASPIRIN 81 MG PO CHEW
81.0000 mg | CHEWABLE_TABLET | Freq: Two times a day (BID) | ORAL | Status: DC
  Administered 2017-08-13 – 2017-08-15 (×4): 81 mg via ORAL
  Filled 2017-08-13 (×4): qty 1

## 2017-08-13 MED ORDER — PHENYLEPHRINE HCL 10 MG/ML IJ SOLN
INTRAMUSCULAR | Status: AC
  Filled 2017-08-13: qty 1

## 2017-08-13 MED ORDER — PHENOL 1.4 % MT LIQD
1.0000 | OROMUCOSAL | Status: DC | PRN
  Filled 2017-08-13: qty 177

## 2017-08-13 MED ORDER — PHENYLEPHRINE HCL 10 MG/ML IJ SOLN
INTRAVENOUS | Status: DC | PRN
  Administered 2017-08-13: 50 ug/min via INTRAVENOUS

## 2017-08-13 MED ORDER — VITAMIN D 1000 UNITS PO TABS
2000.0000 [IU] | ORAL_TABLET | Freq: Every day | ORAL | Status: DC
Start: 2017-08-13 — End: 2017-08-15
  Administered 2017-08-14 – 2017-08-15 (×2): 2000 [IU] via ORAL
  Filled 2017-08-13 (×2): qty 2

## 2017-08-13 MED ORDER — ACETAMINOPHEN 650 MG RE SUPP: 650.0000 mg | RECTAL | Status: DC | PRN

## 2017-08-13 MED ORDER — MIDAZOLAM HCL 5 MG/5ML IJ SOLN
INTRAMUSCULAR | Status: DC | PRN
  Administered 2017-08-13: 2 mg via INTRAVENOUS

## 2017-08-13 MED ORDER — ONDANSETRON HCL 4 MG/2ML IJ SOLN
INTRAMUSCULAR | Status: AC
  Filled 2017-08-13: qty 2

## 2017-08-13 MED ORDER — STERILE WATER FOR IRRIGATION IR SOLN
Status: DC | PRN
  Administered 2017-08-13: 3000 mL

## 2017-08-13 MED ORDER — DEXAMETHASONE SODIUM PHOSPHATE 10 MG/ML IJ SOLN
INTRAMUSCULAR | Status: DC | PRN
  Administered 2017-08-13: 10 mg via INTRAVENOUS

## 2017-08-13 MED ORDER — METOCLOPRAMIDE HCL 5 MG/ML IJ SOLN: 5.0000 mg | Freq: Three times a day (TID) | INTRAMUSCULAR | Status: DC | PRN

## 2017-08-13 MED ORDER — SODIUM CHLORIDE 0.9 % IV SOLN
1000.0000 mg | INTRAVENOUS | Status: AC
  Administered 2017-08-13: 1000 mg via INTRAVENOUS
  Filled 2017-08-13: qty 1100

## 2017-08-13 MED ORDER — PROPOFOL 10 MG/ML IV BOLUS
INTRAVENOUS | Status: AC
Start: 2017-08-13 — End: 2017-08-13
  Filled 2017-08-13: qty 40

## 2017-08-13 MED ORDER — FENTANYL CITRATE (PF) 100 MCG/2ML IJ SOLN
INTRAMUSCULAR | Status: DC | PRN
Start: 2017-08-13 — End: 2017-08-13
  Administered 2017-08-13: 100 ug via INTRAVENOUS

## 2017-08-13 MED ORDER — HYDROMORPHONE HCL-NACL 0.5-0.9 MG/ML-% IV SOSY: 0.2500 mg | PREFILLED_SYRINGE | INTRAVENOUS | Status: DC | PRN

## 2017-08-13 MED ORDER — BUPROPION HCL ER (XL) 150 MG PO TB24
150.0000 mg | ORAL_TABLET | Freq: Every day | ORAL | Status: DC
Start: 2017-08-14 — End: 2017-08-15
  Administered 2017-08-14 – 2017-08-15 (×2): 150 mg via ORAL
  Filled 2017-08-13 (×2): qty 1

## 2017-08-13 MED ORDER — OXYCODONE HCL 5 MG PO TABS
10.0000 mg | ORAL_TABLET | ORAL | Status: DC | PRN
  Administered 2017-08-13 – 2017-08-15 (×13): 10 mg via ORAL
  Filled 2017-08-13 (×13): qty 2

## 2017-08-13 MED ORDER — ONDANSETRON HCL 4 MG/2ML IJ SOLN
INTRAMUSCULAR | Status: DC | PRN
  Administered 2017-08-13: 4 mg via INTRAVENOUS

## 2017-08-13 MED ORDER — MIDAZOLAM HCL 2 MG/2ML IJ SOLN
INTRAMUSCULAR | Status: AC
  Filled 2017-08-13: qty 2

## 2017-08-13 MED ORDER — METOCLOPRAMIDE HCL 5 MG PO TABS: 5.0000 mg | ORAL_TABLET | Freq: Three times a day (TID) | ORAL | Status: DC | PRN

## 2017-08-13 MED ORDER — CLINDAMYCIN PHOSPHATE 600 MG/50ML IV SOLN
600.0000 mg | Freq: Four times a day (QID) | INTRAVENOUS | Status: AC
  Administered 2017-08-13 (×2): 600 mg via INTRAVENOUS
  Filled 2017-08-13 (×3): qty 50

## 2017-08-13 MED ORDER — CHLORHEXIDINE GLUCONATE 4 % EX LIQD: 60.0000 mL | Freq: Once | CUTANEOUS | Status: DC

## 2017-08-13 MED ORDER — SODIUM CHLORIDE 0.9 % IR SOLN
Status: DC | PRN
  Administered 2017-08-13: 1000 mL

## 2017-08-13 MED ORDER — FLUTICASONE PROPIONATE 50 MCG/ACT NA SUSP
2.0000 | Freq: Every day | NASAL | Status: DC | PRN
  Filled 2017-08-13: qty 16

## 2017-08-13 MED ORDER — BUPIVACAINE HCL (PF) 0.5 % IJ SOLN
INTRAMUSCULAR | Status: AC
  Filled 2017-08-13: qty 30

## 2017-08-13 MED ORDER — LACTATED RINGERS IV SOLN
INTRAVENOUS | Status: DC
  Administered 2017-08-13 (×2): via INTRAVENOUS

## 2017-08-13 MED ORDER — CLINDAMYCIN PHOSPHATE 900 MG/50ML IV SOLN
900.0000 mg | INTRAVENOUS | Status: AC
  Administered 2017-08-13: 900 mg via INTRAVENOUS

## 2017-08-13 MED ORDER — DOCUSATE SODIUM 100 MG PO CAPS
100.0000 mg | ORAL_CAPSULE | Freq: Two times a day (BID) | ORAL | Status: DC
  Administered 2017-08-13 – 2017-08-15 (×4): 100 mg via ORAL
  Filled 2017-08-13 (×4): qty 1

## 2017-08-13 MED ORDER — ONDANSETRON HCL 4 MG PO TABS: 4.0000 mg | ORAL_TABLET | Freq: Four times a day (QID) | ORAL | Status: DC | PRN

## 2017-08-13 MED ORDER — CLINDAMYCIN PHOSPHATE 900 MG/50ML IV SOLN
INTRAVENOUS | Status: AC
  Filled 2017-08-13: qty 50

## 2017-08-13 MED ORDER — DIPHENHYDRAMINE HCL 12.5 MG/5ML PO ELIX: 12.5000 mg | ORAL_SOLUTION | ORAL | Status: DC | PRN

## 2017-08-13 MED ORDER — OXYCODONE HCL 5 MG PO TABS
5.0000 mg | ORAL_TABLET | ORAL | Status: DC | PRN
  Administered 2017-08-13: 5 mg via ORAL
  Filled 2017-08-13: qty 1

## 2017-08-13 MED ORDER — ALUM & MAG HYDROXIDE-SIMETH 200-200-20 MG/5ML PO SUSP: 30.0000 mL | ORAL | Status: DC | PRN

## 2017-08-13 MED ORDER — BUPIVACAINE HCL (PF) 0.5 % IJ SOLN
INTRAMUSCULAR | Status: DC | PRN
  Administered 2017-08-13: 15 mg via INTRATHECAL

## 2017-08-13 MED ORDER — SODIUM CHLORIDE 0.9 % IV SOLN
INTRAVENOUS | Status: DC
  Administered 2017-08-13: 15:00:00 via INTRAVENOUS

## 2017-08-13 MED ORDER — PHENYLEPHRINE 40 MCG/ML (10ML) SYRINGE FOR IV PUSH (FOR BLOOD PRESSURE SUPPORT)
PREFILLED_SYRINGE | INTRAVENOUS | Status: DC | PRN
  Administered 2017-08-13 (×5): 80 ug via INTRAVENOUS

## 2017-08-13 MED ORDER — HYDROMORPHONE HCL 1 MG/ML IJ SOLN
1.0000 mg | INTRAMUSCULAR | Status: DC | PRN
  Administered 2017-08-13 – 2017-08-14 (×3): 1 mg via INTRAVENOUS
  Filled 2017-08-13 (×3): qty 1

## 2017-08-13 MED ORDER — PROPOFOL 500 MG/50ML IV EMUL
INTRAVENOUS | Status: DC | PRN
  Administered 2017-08-13: 100 ug/kg/min via INTRAVENOUS

## 2017-08-13 MED ORDER — ACETAMINOPHEN 325 MG PO TABS
650.0000 mg | ORAL_TABLET | ORAL | Status: DC | PRN
  Administered 2017-08-14 (×2): 650 mg via ORAL
  Filled 2017-08-13 (×2): qty 2

## 2017-08-13 MED ORDER — DEXAMETHASONE SODIUM PHOSPHATE 10 MG/ML IJ SOLN
INTRAMUSCULAR | Status: AC
  Filled 2017-08-13: qty 1

## 2017-08-13 SURGICAL SUPPLY — 37 items
BAG ZIPLOCK 12X15 (MISCELLANEOUS) IMPLANT
BENZOIN TINCTURE PRP APPL 2/3 (GAUZE/BANDAGES/DRESSINGS) IMPLANT
BLADE SAW SGTL 18X1.27X75 (BLADE) ×2 IMPLANT
CAPT HIP TOTAL 2 ×2 IMPLANT
CELLS DAT CNTRL 66122 CELL SVR (MISCELLANEOUS) ×1 IMPLANT
COVER PERINEAL POST (MISCELLANEOUS) ×2 IMPLANT
COVER SURGICAL LIGHT HANDLE (MISCELLANEOUS) ×2 IMPLANT
DRAPE STERI IOBAN 125X83 (DRAPES) ×2 IMPLANT
DRAPE U-SHAPE 47X51 STRL (DRAPES) ×4 IMPLANT
DRSG AQUACEL AG ADV 3.5X10 (GAUZE/BANDAGES/DRESSINGS) ×2 IMPLANT
DURAPREP 26ML APPLICATOR (WOUND CARE) ×2 IMPLANT
ELECT REM PT RETURN 15FT ADLT (MISCELLANEOUS) ×2 IMPLANT
GAUZE XEROFORM 1X8 LF (GAUZE/BANDAGES/DRESSINGS) IMPLANT
GLOVE BIO SURGEON STRL SZ7.5 (GLOVE) ×2 IMPLANT
GLOVE BIOGEL PI IND STRL 7.5 (GLOVE) ×4 IMPLANT
GLOVE BIOGEL PI IND STRL 8 (GLOVE) ×2 IMPLANT
GLOVE BIOGEL PI INDICATOR 7.5 (GLOVE) ×4
GLOVE BIOGEL PI INDICATOR 8 (GLOVE) ×2
GLOVE ECLIPSE 8.0 STRL XLNG CF (GLOVE) ×2 IMPLANT
GOWN STRL REUS W/ TWL LRG LVL3 (GOWN DISPOSABLE) ×2 IMPLANT
GOWN STRL REUS W/TWL LRG LVL3 (GOWN DISPOSABLE) ×2
GOWN STRL REUS W/TWL XL LVL3 (GOWN DISPOSABLE) ×4 IMPLANT
HANDPIECE INTERPULSE COAX TIP (DISPOSABLE) ×1
HOLDER FOLEY CATH W/STRAP (MISCELLANEOUS) ×2 IMPLANT
PACK ANTERIOR HIP CUSTOM (KITS) ×2 IMPLANT
RTRCTR WOUND ALEXIS 18CM MED (MISCELLANEOUS) ×2
SET HNDPC FAN SPRY TIP SCT (DISPOSABLE) ×1 IMPLANT
STAPLER VISISTAT 35W (STAPLE) IMPLANT
STRIP CLOSURE SKIN 1/2X4 (GAUZE/BANDAGES/DRESSINGS) ×2 IMPLANT
SUT ETHIBOND NAB CT1 #1 30IN (SUTURE) ×2 IMPLANT
SUT MNCRL AB 4-0 PS2 18 (SUTURE) ×2 IMPLANT
SUT VIC AB 0 CT1 36 (SUTURE) ×2 IMPLANT
SUT VIC AB 1 CT1 36 (SUTURE) ×2 IMPLANT
SUT VIC AB 2-0 CT1 27 (SUTURE) ×2
SUT VIC AB 2-0 CT1 TAPERPNT 27 (SUTURE) ×2 IMPLANT
TRAY FOLEY W/METER SILVER 16FR (SET/KITS/TRAYS/PACK) ×2 IMPLANT
YANKAUER SUCT BULB TIP 10FT TU (MISCELLANEOUS) ×2 IMPLANT

## 2017-08-13 NOTE — Anesthesia Procedure Notes (Signed)
Spinal  Start time: 08/13/2017 11:46 AM End time: 08/13/2017 11:48 AM Staffing Resident/CRNA: Harle Stanford R Preanesthetic Checklist Completed: patient identified, site marked, surgical consent, pre-op evaluation, timeout performed, IV checked, risks and benefits discussed and monitors and equipment checked Spinal Block Patient position: sitting Prep: Betadine Patient monitoring: heart rate, cardiac monitor, continuous pulse ox and blood pressure Approach: midline Location: L3-4 Injection technique: single-shot Needle Needle type: Pencan  Needle gauge: 24 G Needle length: 10 cm Needle insertion depth: 7 cm Assessment Sensory level: T6 Additional Notes Timeout performed. SAB kit date checked. SAB without difficulty

## 2017-08-13 NOTE — Anesthesia Procedure Notes (Signed)
Performed by: Karver Fadden R       

## 2017-08-13 NOTE — H&P (Signed)
TOTAL HIP ADMISSION H&P  Patient is admitted for right total hip arthroplasty.  Subjective:  Chief Complaint: right hip pain  HPI: Yolanda Ochoa, 56 y.o. female, has a history of pain and functional disability in the right hip(s) due to arthritis and patient has failed non-surgical conservative treatments for greater than 12 weeks to include NSAID's and/or analgesics, corticosteriod injections, flexibility and strengthening excercises and activity modification.  Onset of symptoms was gradual starting 2 years ago with gradually worsening course since that time.The patient noted no past surgery on the right hip(s).  Patient currently rates pain in the right hip at 10 out of 10 with activity. Patient has night pain, worsening of pain with activity and weight bearing, pain that interfers with activities of daily living and pain with passive range of motion. Patient has evidence of subchondral cysts, subchondral sclerosis and joint space narrowing and periarticular osteophytes by imaging studies. This condition presents safety issues increasing the risk of falls.  There is no current active infection.  Patient Active Problem List   Diagnosis Date Noted  . Unilateral primary osteoarthritis, right hip 08/13/2017  . Unilateral primary osteoarthritis, left hip 02/22/2017  . Pain of left hip joint 02/22/2017   Past Medical History:  Diagnosis Date  . Anemia    hx of  . Anxiety   . Arthritis   . Asthma   . GERD (gastroesophageal reflux disease)    hx of no meds  . Pneumonia    walking pneumonia    Past Surgical History:  Procedure Laterality Date  . fatty tumor removed     back of neck and R shoulder  . FOOT SURGERY     bil bunion and hammer toe repair  . NASAL SINUS SURGERY    . TONSILLECTOMY      Current Facility-Administered Medications  Medication Dose Route Frequency Provider Last Rate Last Dose  . chlorhexidine (HIBICLENS) 4 % liquid 4 application  60 mL Topical Once Richardean Canal  W, PA-C      . clindamycin (CLEOCIN) 900 MG/50ML IVPB           . clindamycin (CLEOCIN) IVPB 900 mg  900 mg Intravenous On Call to OR Kirtland Bouchard, PA-C      . lactated ringers infusion   Intravenous Continuous Eilene Ghazi, MD 50 mL/hr at 08/13/17 1032    . tranexamic acid (CYKLOKAPRON) 1,000 mg in sodium chloride 0.9 % 100 mL IVPB  1,000 mg Intravenous To OR Kirtland Bouchard, PA-C       Allergies  Allergen Reactions  . Codeine Rash  . Penicillins Rash    Social History  Substance Use Topics  . Smoking status: Never Smoker  . Smokeless tobacco: Never Used  . Alcohol use 0.6 oz/week    1 Glasses of wine per week     Comment: socially    History reviewed. No pertinent family history.   Review of Systems  Musculoskeletal: Positive for joint pain.  All other systems reviewed and are negative.   Objective:  Physical Exam  Constitutional: She is oriented to person, place, and time. She appears well-developed and well-nourished.  HENT:  Head: Normocephalic and atraumatic.  Eyes: Pupils are equal, round, and reactive to light. EOM are normal.  Neck: Normal range of motion.  Cardiovascular: Normal rate and regular rhythm.   Respiratory: Effort normal and breath sounds normal.  GI: Soft. Bowel sounds are normal.  Musculoskeletal:       Right hip: She exhibits  decreased range of motion, decreased strength, tenderness and bony tenderness.  Neurological: She is alert and oriented to person, place, and time.  Skin: Skin is warm and dry.  Psychiatric: She has a normal mood and affect.    Vital signs in last 24 hours: Temp:  [98.5 F (36.9 C)] 98.5 F (36.9 C) (10/19 1007) Pulse Rate:  [81] 81 (10/19 1007) Resp:  [16] 16 (10/19 1007) BP: (120)/(77) 120/77 (10/19 1007) SpO2:  [96 %] 96 % (10/19 1007) Weight:  [171 lb (77.6 kg)] 171 lb (77.6 kg) (10/19 1020)  Labs:   Estimated body mass index is 29.35 kg/m as calculated from the following:   Height as of this encounter:  5\' 4"  (1.626 m).   Weight as of this encounter: 171 lb (77.6 kg).   Imaging Review Plain radiographs demonstrate severe degenerative joint disease of the right hip(s). The bone quality appears to be excellent for age and reported activity level.  Assessment/Plan:  End stage arthritis, right hip(s)  The patient history, physical examination, clinical judgement of the provider and imaging studies are consistent with end stage degenerative joint disease of the right hip(s) and total hip arthroplasty is deemed medically necessary. The treatment options including medical management, injection therapy, arthroscopy and arthroplasty were discussed at length. The risks and benefits of total hip arthroplasty were presented and reviewed. The risks due to aseptic loosening, infection, stiffness, dislocation/subluxation,  thromboembolic complications and other imponderables were discussed.  The patient acknowledged the explanation, agreed to proceed with the plan and consent was signed. Patient is being admitted for inpatient treatment for surgery, pain control, PT, OT, prophylactic antibiotics, VTE prophylaxis, progressive ambulation and ADL's and discharge planning.The patient is planning to be discharged home with home health services

## 2017-08-13 NOTE — Transfer of Care (Signed)
Immediate Anesthesia Transfer of Care Note  Patient: Yolanda Ochoa  Procedure(s) Performed: RIGHT TOTAL HIP ARTHROPLASTY ANTERIOR APPROACH (Right Hip)  Patient Location: PACU  Anesthesia Type:Spinal  Level of Consciousness: awake, alert  and oriented  Airway & Oxygen Therapy: Patient Spontanous Breathing and Patient connected to face mask oxygen  Post-op Assessment: Report given to RN and Post -op Vital signs reviewed and stable  Post vital signs: Reviewed and stable  Last Vitals:  Vitals:   08/13/17 1007  BP: 120/77  Pulse: 81  Resp: 16  Temp: 36.9 C  SpO2: 96%    Last Pain:  Vitals:   08/13/17 1020  TempSrc:   PainSc: 7          Complications: No apparent anesthesia complications

## 2017-08-13 NOTE — Brief Op Note (Signed)
08/13/2017  1:10 PM  PATIENT:  Yolanda Ochoa  56 y.o. female  PRE-OPERATIVE DIAGNOSIS:  osteoarthritis right hip  POST-OPERATIVE DIAGNOSIS:  osteoarthritis right hip  PROCEDURE:  Procedure(s): RIGHT TOTAL HIP ARTHROPLASTY ANTERIOR APPROACH (Right)  SURGEON:  Surgeon(s) and Role:    Kathryne Hitch* Blackman, Christopher Y, MD - Primary  PHYSICIAN ASSISTANT: Rexene EdisonGil Clark, PA-C  ANESTHESIA:   spinal  EBL:  200 mL   COUNTS:  YES  TOURNIQUET:  * No tourniquets in log *  PLAN OF CARE: Admit to inpatient   PATIENT DISPOSITION:  PACU - hemodynamically stable.   Delay start of Pharmacological VTE agent (>24hrs) due to surgical blood loss or risk of bleeding: no

## 2017-08-13 NOTE — Anesthesia Preprocedure Evaluation (Signed)
Anesthesia Evaluation  Patient identified by MRN, date of birth, ID band Patient awake    Reviewed: Allergy & Precautions, NPO status , Patient's Chart, lab work & pertinent test results  Airway Mallampati: II  TM Distance: >3 FB Neck ROM: Full    Dental no notable dental hx.    Pulmonary neg pulmonary ROS,    Pulmonary exam normal breath sounds clear to auscultation       Cardiovascular negative cardio ROS Normal cardiovascular exam Rhythm:Regular Rate:Normal     Neuro/Psych negative neurological ROS  negative psych ROS   GI/Hepatic negative GI ROS, Neg liver ROS,   Endo/Other  negative endocrine ROS  Renal/GU negative Renal ROS  negative genitourinary   Musculoskeletal  (+) Arthritis , Osteoarthritis,    Abdominal   Peds negative pediatric ROS (+)  Hematology negative hematology ROS (+)   Anesthesia Other Findings   Reproductive/Obstetrics negative OB ROS                             Anesthesia Physical Anesthesia Plan  ASA: II  Anesthesia Plan: Spinal   Post-op Pain Management:    Induction: Intravenous  PONV Risk Score and Plan: 1 and Ondansetron, Dexamethasone and Treatment may vary due to age or medical condition  Airway Management Planned: Simple Face Mask  Additional Equipment:   Intra-op Plan:   Post-operative Plan:   Informed Consent: I have reviewed the patients History and Physical, chart, labs and discussed the procedure including the risks, benefits and alternatives for the proposed anesthesia with the patient or authorized representative who has indicated his/her understanding and acceptance.   Dental advisory given  Plan Discussed with: CRNA and Surgeon  Anesthesia Plan Comments:         Anesthesia Quick Evaluation

## 2017-08-13 NOTE — Anesthesia Postprocedure Evaluation (Signed)
Anesthesia Post Note  Patient: Dorita FrayMartha R Piccininni  Procedure(s) Performed: RIGHT TOTAL HIP ARTHROPLASTY ANTERIOR APPROACH (Right Hip)     Patient location during evaluation: PACU Anesthesia Type: Spinal Level of consciousness: oriented and awake and alert Pain management: pain level controlled Vital Signs Assessment: post-procedure vital signs reviewed and stable Respiratory status: spontaneous breathing, respiratory function stable and nonlabored ventilation Cardiovascular status: blood pressure returned to baseline and stable Postop Assessment: no headache, no backache, no apparent nausea or vomiting and spinal receding Anesthetic complications: no    Last Vitals:  Vitals:   08/13/17 1500 08/13/17 1515  BP: 101/65 102/71  Pulse: 65 70  Resp: (!) 8 (!) 9  Temp:    SpO2: 97% 99%    Last Pain:  Vitals:   08/13/17 1020  TempSrc:   PainSc: 7                  Amiee Wiley A.

## 2017-08-14 LAB — BASIC METABOLIC PANEL
ANION GAP: 10 (ref 5–15)
BUN: 7 mg/dL (ref 6–20)
CHLORIDE: 103 mmol/L (ref 101–111)
CO2: 26 mmol/L (ref 22–32)
Calcium: 8.7 mg/dL — ABNORMAL LOW (ref 8.9–10.3)
Creatinine, Ser: 0.59 mg/dL (ref 0.44–1.00)
GFR calc Af Amer: >60 mL/min (ref >60)
GLUCOSE: 129 mg/dL — AB (ref 65–99)
POTASSIUM: 3.9 mmol/L (ref 3.5–5.1)
SODIUM: 139 mmol/L (ref 135–145)

## 2017-08-14 LAB — CBC
HCT: 32.4 % — ABNORMAL LOW (ref 36.0–46.0)
HEMOGLOBIN: 10.7 g/dL — AB (ref 12.0–15.0)
MCH: 31 pg (ref 26.0–34.0)
MCHC: 33 g/dL (ref 30.0–36.0)
MCV: 93.9 fL (ref 78.0–100.0)
Platelets: 335 10*3/uL (ref 150–400)
RBC: 3.45 MIL/uL — ABNORMAL LOW (ref 3.87–5.11)
RDW: 12.4 % (ref 11.5–15.5)
WBC: 11.4 10*3/uL — AB (ref 4.0–10.5)

## 2017-08-14 MED ORDER — ASPIRIN 81 MG PO CHEW: 81.0000 mg | CHEWABLE_TABLET | Freq: Two times a day (BID) | ORAL | 0 refills | Status: DC

## 2017-08-14 NOTE — Evaluation (Signed)
Physical Therapy Evaluation Patient Details Name: Yolanda Ochoa MRN: [id number] DOB: 19-Apr-1961 Today's Date: 08/14/2017   History of Present Illness  Pt is s/p R DTHA with PMH anxiety, anemia, and foot surgery  Clinical Impression  Pt is s/p THA resulting in the deficits listed below (see PT Problem List).  Pt will benefit from skilled PT to increase their independence and safety with mobility to allow discharge to the venue listed below.      Follow Up Recommendations DC plan and follow up therapy as arranged by surgeon    Equipment Recommendations  Rolling walker with 5" wheels    Recommendations for Other Services       Precautions / Restrictions Precautions Precautions: Fall Restrictions Weight Bearing Restrictions: No Other Position/Activity Restrictions: WBAT      Mobility  Bed Mobility Overal bed mobility: Needs Assistance Bed Mobility: Sit to Supine     Supine to sit: Min assist;HOB elevated Sit to supine: Min assist   General bed mobility comments: assist with RLE  Transfers Overall transfer level: Needs assistance Equipment used: Rolling walker (2 wheeled) Transfers: Sit to/from Stand Sit to Stand: Min assist;Min guard         General transfer comment: verbal cues for hand placement and LE management.  Ambulation/Gait Ambulation/Gait assistance: Min guard Ambulation Distance (Feet): 15 Feet Assistive device: Rolling walker (2 wheeled) Gait Pattern/deviations: Step-through pattern;Step-to pattern;Antalgic     General Gait Details: cues for sequence  Stairs            Wheelchair Mobility    Modified Rankin (Stroke Patients Only)       Balance                                             Pertinent Vitals/Pain Pain Assessment: 0-10 Pain Score: 7  Pain Location: R hip Pain Descriptors / Indicators: Aching Pain Intervention(s): Monitored during session;Ice applied;Premedicated before session;Patient requesting  pain meds-RN notified    Home Living Family/patient expects to be discharged to:: Private residence Living Arrangements: Other relatives Available Help at Discharge: Family;Available 24 hours/day Type of Home: House Home Access: Stairs to enter   Entergy CorporationEntrance Stairs-Number of Steps: 2 Home Layout: One level Home Equipment: Adaptive equipment;Bedside commode;Hand held shower head;Shower seat      Prior Function Level of Independence: Independent with assistive device(s)         Comments: using friend's walker 2 wks prior to surgery     Hand Dominance        Extremity/Trunk Assessment   Upper Extremity Assessment Upper Extremity Assessment: Defer to OT evaluation;[facility] for tasks assessed    Lower Extremity Assessment Lower Extremity Assessment: RLE deficits/detail RLE Deficits / Details: grossly 3+/5, AAROM WFL hip/knee; ankle WFL       Communication   Communication: No difficulties  Cognition Arousal/Alertness: Awake/alert Behavior During Therapy: WFL for tasks assessed/performed Overall Cognitive Status: Within Functional Limits for tasks assessed                                        General Comments      Exercises Total Joint Exercises Ankle Circles/Pumps: AROM;Both;10 reps Quad Sets: 10 reps;Both;AROM Heel Slides: AAROM;Right;10 reps Hip ABduction/ADduction: AAROM;Right;10 reps   Assessment/Plan    PT Assessment Patient needs  continued PT services  PT Problem List Decreased strength;Decreased mobility;Decreased knowledge of use of DME;Decreased activity tolerance       PT Treatment Interventions Functional mobility training;Gait training;DME instruction;Therapeutic activities;Therapeutic exercise;Patient/family education    PT Goals (Current goals can be found in the Care Plan section)  Acute Rehab PT Goals Patient Stated Goal: to return to independence. PT Goal Formulation: With patient Time For Goal Achievement:  08/18/17 Potential to Achieve Goals: Good    Frequency 7X/week   Barriers to discharge        Co-evaluation               AM-PAC PT "6 Clicks" Daily Activity  Outcome Measure Difficulty turning over in bed (including adjusting bedclothes, sheets and blankets)?: Unable Difficulty moving from lying on back to sitting on the side of the bed? : Unable Difficulty sitting down on and standing up from a chair with arms (e.g., wheelchair, bedside commode, etc,.)?: Unable Help needed moving to and from a bed to chair (including a wheelchair)?: A Little Help needed walking in hospital room?: A Little Help needed climbing 3-5 steps with a railing? : A Lot 6 Click Score: 11    End of Session Equipment Utilized During Treatment: Gait belt Activity Tolerance: Patient tolerated treatment well Patient left: with call bell/phone within reach;in bed;with family/visitor present   PT Visit Diagnosis: Difficulty in walking, not elsewhere classified (R26.2);Pain Pain - Right/Left: Right Pain - part of body: Hip    Time: 1610-9604 PT Time Calculation (min) (ACUTE ONLY): 16 min   Charges:   PT Evaluation $PT Eval Low Complexity: 1 Low     PT G CodesDrucilla Chalet, PT Pager: [phone] 08/14/2017   [facility] 08/14/2017, 11:47 AM

## 2017-08-14 NOTE — Discharge Instructions (Signed)

## 2017-08-14 NOTE — Progress Notes (Signed)
   Subjective: 1 Day Post-Op Procedure(s) (LRB): RIGHT TOTAL HIP ARTHROPLASTY ANTERIOR APPROACH (Right) Patient reports pain as moderate.    Objective: Vital signs in last 24 hours: Temp:  [97.6 F (36.4 C)-98.5 F (36.9 C)] 98.3 F (36.8 C) (10/20 0501) Pulse Rate:  [58-81] 73 (10/20 0501) Resp:  [7-17] 16 (10/20 0501) BP: (90-120)/(58-93) 102/63 (10/20 0501) SpO2:  [93 %-100 %] 96 % (10/20 0501) Weight:  [171 lb (77.6 kg)] 171 lb (77.6 kg) (10/19 1020)  Intake/Output from previous day: 10/19 0701 - 10/20 0700 In: 4735 [P.O.:1380; I.V.:3250; IV Piggyback:105] Out: 3025 [Urine:2825; Blood:200] Intake/Output this shift: No intake/output data recorded.   Recent Labs  08/14/17 0600  HGB 10.7*    Recent Labs  08/14/17 0600  WBC 11.4*  RBC 3.45*  HCT 32.4*  PLT 335    Recent Labs  08/14/17 0600  NA 139  K 3.9  CL 103  CO2 26  BUN 7  CREATININE 0.59  GLUCOSE 129*  CALCIUM 8.7*   No results for input(s): LABPT, INR in the last 72 hours.  Neurologically intact , dressing spot blood. Thigh soft . LL equal sciatic intact.  Dg Pelvis Portable  Result Date: 08/13/2017 CLINICAL DATA:  56 year old female post right hip replacement. Subsequent encounter. EXAM: PORTABLE PELVIS 1-2 VIEWS COMPARISON:  Intraoperative exam 08/13/2017. Preoperative exam 08/03/2017. FINDINGS: Right total hip replacement appears in satisfactory position on this single projection. There may be slight fragmentation of the right lateral acetabular osteophyte versus crossing artifact. Prominent left hip joint degenerative changes. IMPRESSION: Right total hip replacement appears in satisfactory position on this single projection. There may be slight fragmentation of the right lateral acetabular osteophyte versus crossing artifact. Electronically Signed   By: Lacy DuverneySteven  Olson M.D.   On: 08/13/2017 14:02   Dg C-arm 1-60 Min-no Report  Result Date: 08/13/2017 Fluoroscopy was utilized by the requesting  physician.  No radiographic interpretation.   Dg Hip Operative Unilat W Or W/o Pelvis Right  Result Date: 08/13/2017 CLINICAL DATA:  Right hip arthroplasty. EXAM: OPERATIVE right HIP (WITH PELVIS IF PERFORMED) 1 VIEWS TECHNIQUE: Fluoroscopic spot image(s) were submitted for interpretation post-operatively. COMPARISON:  None. FINDINGS: Three spot images obtained portably in the operating room via C-arm radiography shows resection of the right femoral head and neck with placement of a right total hip arthroplasty device. Hardware components are in anatomic alignment. No complications. IMPRESSION: 1. Status post right total hip arthroplasty Electronically Signed   By: Signa Kellaylor  Stroud M.D.   On: 08/13/2017 13:19    Assessment/Plan: 1 Day Post-Op Procedure(s) (LRB): RIGHT TOTAL HIP ARTHROPLASTY ANTERIOR APPROACH (Right) Plan:   Home in AM. Continue PT.    Eldred MangesMark C Avaley Coop 08/14/2017, 9:40 AM

## 2017-08-14 NOTE — Op Note (Signed)
Yolanda Cassis:  Ochoa, Yolanda Ochoa                ACCOUNT NO.:  [id number]  MEDICAL RECORD NO.:  [id number]  LOCATION:  PERIO                        [facility]  PHYSICIAN:  Vanita PandaChristopher Y. Magnus IvanBlackman, M.D.DATE OF BIRTH:  27-Jul-1961  DATE OF PROCEDURE:  08/13/2017 DATE OF DISCHARGE:                              OPERATIVE REPORT   PREOPERATIVE DIAGNOSIS:  Primary osteoarthritis and degenerative joint disease, right hip.  POSTOPERATIVE DIAGNOSIS:  Primary osteoarthritis and degenerative joint disease, right hip.  PROCEDURE:  Right total hip arthroplasty through direct anterior approach.  IMPLANTS:  DePuy Sector Gription acetabular component size 50, size 32/+0 polyethylene liner, size 10 Corail femoral component with standard offset, size 32/+1 ceramic hip ball.  SURGEON:  Vanita PandaChristopher Y. Magnus IvanBlackman, MD.  ASSISTANT:  Richardean CanalGilbert Clark, PA-C.  ANESTHESIA:  Spinal.  ANTIBIOTICS:  900 mg of IV clindamycin.  BLOOD LOSS:  100 mL.  COMPLICATIONS:  None.  INDICATIONS:  Ms. Nelva BushViall is a 56 year old female with bilateral hip osteoarthritis with both being severely painful with the right worse than left.  Both hips show radiographic evidence of worsening arthritis as well.  She has tried and failed all forms of conservative treatment, and at this point, her pain is daily and it has detrimentally affected her activities of daily living, her quality of life, and her mobility. She does wish to proceed with a total hip arthroplasty given the radiographic findings and the failure of conservative treatment.  She understands the risk of acute blood loss anemia, nerve and vessel injury, fracture, infection, dislocation, and DVT.  She understands our goals are to decrease pain, improved mobility, and overall improved quality of life.  PROCEDURE DESCRIPTION:  After informed consent was obtained, appropriate right hip was marked.  She was brought to the operating room where spinal anesthesia was obtained while  she was on her stretcher.  A Foley catheter was placed and both feet had traction boots applied to them. Next, she was placed supine on the Hana fracture table with perineal post in place and both legs in inline skeletal traction, but no traction applied.  Her right operative hip was prepped and draped with DuraPrep and sterile drapes.  Time-out was called and she was identified as correct patient and correct right hip.  We then made an incision just inferior and posterior to the anterior superior iliac spine and carried this slightly obliquely down the leg.  We dissected down tensor fascia lata muscle.  The tensor fascia was divided longitudinally to proceed with a direct anterior approach to the hip.  We identified and cauterized circumflex vessels and identified the hip capsule.  I opened the hip capsule in an L-type format finding a large serosanguineous effusion and synovitis that was quite significant.  We placed Cobra retractor on the medial and lateral femoral neck and then made our femoral neck cut proximal to lesser trochanter with an oscillating saw and completed this on osteotome.  I placed a corkscrew guide in the femoral head and removed the femoral head in its entirety and found to be significantly flattened and even worse than her preoperative x-rays. We then placed a bent Hohmann over the acetabular rim and cleaned the acetabular remnants of  acetabular labrum and other debris.  We then began reaming under direct visualization from a size 42 reamer up to a size 50 with all reamers under direct visualization and the last reamer under direct fluoroscopy, so we could obtain our depth of reaming, our inclination, and anteversion.  Once we were pleased with this, we placed a real DePuy Sector Gription acetabular component size 50 and a 32/+0 polyethylene liner for that size acetabular component.  Attention was then turned to the femur.  With the leg externally rotated to  120 degrees extended and adducted, we were to place a Mueller retractor medially and a Hohmann retractor behind the greater trochanter.  We released the lateral joint capsule and used a box cutting osteotome to enter the femoral canal and a rongeur to lateralize.  I then began broaching from a size 8 broach using the Corail broaching system going up to a size 10.  With a size 10 in place, we trialed a standard offset femoral neck and a 32/+1 hip ball, reduced this in the acetabulum.  We were pleased with leg length, offset, stability, and range of motion. We then dislocated the hip and removed the trial components.  We were able to place the real Corail femoral component with standard offset, size 10 and the real 32/-1 hip ball.  Again reduced this in the acetabulum and we were pleased with stability, range of motion and offset.  We then irrigated the hip with normal saline solution using pulsatile lavage.  We were able to close the joint capsule with interrupted #1 Ethibond suture followed by running #1 Vicryl on the tensor fascia, 0 Vicryl on the deep tissue, 2-0 Vicryl on the subcutaneous tissue, 4-0 Monocryl subcuticular stitch and Steri-Strips on the skin.  Well-padded sterile dressing was applied.  She was taken off the Hana table and taken to the recovery room in stable condition. All final counts were correct.  There were no complications noted.  Of note, Richardean Canal, PA-C, assisted in the entire case.  His assistance was crucial for facilitating all aspects of this case.     Vanita Panda. Magnus Ivan, M.D.     CYB/MEDQ  D:  08/13/2017  T:  08/14/2017  Job:  [id number]

## 2017-08-14 NOTE — Evaluation (Addendum)
Occupational Therapy Evaluation Patient Details Name: Yolanda FrayMartha R Ochoa MRN: [id number] DOB: 30-Mar-1961 Today's Date: 08/14/2017    History of Present Illness Pt is s/p R DTHA with PMH anxiety, anemia, and foot surgery   Clinical Impression   Pt doing well and tolerated up to 3in1 in bathroom and then to chair with walker today with min assist. She states she has family that are available to assist at d/c. Will continue to follow on acute as pt will benefit from additional OT to progress ADL independence.     Follow Up Recommendations  No OT follow up;Supervision/Assistance - 24 hour    Equipment Recommendations  Other (comment) (RW )    Recommendations for Other Services       Precautions / Restrictions Precautions Precautions: Fall Restrictions Weight Bearing Restrictions: No      Mobility Bed Mobility Overal bed mobility: Needs Assistance Bed Mobility: Supine to Sit     Supine to sit: Min assist;HOB elevated     General bed mobility comments: min assist for R LE over to EOB.  Transfers Overall transfer level: Needs assistance Equipment used: Rolling walker (2 wheeled) Transfers: Sit to/from Stand Sit to Stand: Min assist         General transfer comment: verbal cues for hand placement and LE management.    Balance                                           ADL either performed or assessed with clinical judgement   ADL Overall ADL's : Needs assistance/impaired Eating/Feeding: Independent;Sitting   Grooming: Wash/dry hands;Sitting;Set up   Upper Body Bathing: Set up;Sitting   Lower Body Bathing: Minimal assistance;Sit to/from stand   Upper Body Dressing : Set up;Sitting   Lower Body Dressing: Moderate assistance;Sit to/from stand   Toilet Transfer: Minimal assistance;Stand-pivot;RW   Toileting- Clothing Manipulation and Hygiene: Minimal assistance;Sit to/from stand         General ADL Comments: Educated on AE options for LB  dressing as pt does own reacher and sock aid but she is able to reach down to her R ankle so anticipate she will be able to reach to her toes fairly soon. Discused sequence for LB dressing and safety with walker. She practiced up to 3in1 in bathroom with walker and did well.      Vision Patient Visual Report: No change from baseline       Perception     Praxis      Pertinent Vitals/Pain Pain Assessment: 0-10 Pain Score: 7  Pain Location: R hip Pain Descriptors / Indicators: Aching Pain Intervention(s): Monitored during session;Ice applied     Hand Dominance     Extremity/Trunk Assessment Upper Extremity Assessment Upper Extremity Assessment: Overall WFL for tasks assessed           Communication Communication Communication: No difficulties   Cognition Arousal/Alertness: Awake/alert Behavior During Therapy: WFL for tasks assessed/performed Overall Cognitive Status: Within Functional Limits for tasks assessed                                     General Comments       Exercises     Shoulder Instructions      Home Living Family/patient expects to be discharged to:: Private residence Living Arrangements: Other relatives Available  Help at Discharge: Family;Available 24 hours/day Type of Home: House Home Access: Stairs to enter [employer] of Steps: 2   Home Layout: One level     Bathroom Shower/Tub: Chief Strategy Officer: Standard     Home Equipment: Merchant navy officer;Bedside commode;Hand held shower head;Shower Engineering geologist: Reacher;Sock aid        Prior Functioning/Environment Level of Independence: Independent with assistive device(s) (had started to use a friend's walker for 2 weeks PTA)                 OT Problem List: Decreased strength;Decreased knowledge of use of DME or AE      OT Treatment/Interventions: Self-care/ADL training;Patient/family education;Therapeutic activities;DME and/or  AE instruction    OT Goals(Current goals can be found in the care plan section) Acute Rehab OT Goals Patient Stated Goal: to return to independence. OT Goal Formulation: With patient Time For Goal Achievement: 08/21/17 Potential to Achieve Goals: Good ADL Goals Pt Will Perform Grooming: with supervision Pt Will Perform Lower Body Bathing: with supervision;sit to/from stand Pt Will Perform Lower Body Dressing: with supervision;sit to/from stand Pt Will Transfer to Toilet: with supervision;ambulating;bedside commode Pt Will Perform Toileting - Clothing Manipulation and hygiene: with supervision;sit to/from stand Pt Will Perform Tub/Shower Transfer: Tub transfer;with min assist;shower seat  OT Frequency: Min 2X/week   Barriers to D/C:            Co-evaluation              AM-PAC PT "6 Clicks" Daily Activity     Outcome Measure Help from another person eating meals?: None Help from another person taking care of personal grooming?: None Help from another person toileting, which includes using toliet, bedpan, or urinal?: A Little Help from another person bathing (including washing, rinsing, drying)?: A Little Help from another person to put on and taking off regular upper body clothing?: A Little Help from another person to put on and taking off regular lower body clothing?: A Little 6 Click Score: 20   End of Session Equipment Utilized During Treatment: Rolling walker;Left knee immobilizer  Activity Tolerance: Patient tolerated treatment well Patient left: in chair;with call bell/phone within reach  OT Visit Diagnosis: Muscle weakness (generalized) (M62.81);Unsteadiness on feet (R26.81)                Time: 8295-6213 OT Time Calculation (min): 34 min Charges:  OT General Charges $OT Visit: 1 Visit OT Evaluation $OT Eval Low Complexity: 1 Low OT Treatments $Therapeutic Activity: 23-37 mins G-Codes:       Zannie Kehr Khrystina Bonnes 08/14/2017, 11:09 AM

## 2017-08-14 NOTE — Progress Notes (Signed)
Physical Therapy Treatment Patient Details Name: Yolanda Ochoa MRN: [id number] DOB: Aug 08, 1961 Today's Date: 08/14/2017    History of Present Illness Pt is s/p R DTHA with PMH anxiety, anemia, and foot surgery    PT Comments    Pt progressing well; continue POC  Follow Up Recommendations  DC plan and follow up therapy as arranged by surgeon     Equipment Recommendations  Rolling walker with 5" wheels    Recommendations for Other Services       Precautions / Restrictions Precautions Precautions: Fall Restrictions Other Position/Activity Restrictions: WBAT    Mobility  Bed Mobility Overal bed mobility: Needs Assistance Bed Mobility: Supine to Sit;Sit to Supine     Supine to sit: Min assist;HOB elevated Sit to supine: Min assist   General bed mobility comments: assist with RLE  Transfers Overall transfer level: Needs assistance Equipment used: Rolling walker (2 wheeled) Transfers: Sit to/from Stand Sit to Stand: Min guard;Supervision         General transfer comment: verbal cues for hand placement and LE management.  Ambulation/Gait Ambulation/Gait assistance: Min guard Ambulation Distance (Feet): 50 Feet (10' more) Assistive device: Rolling walker (2 wheeled) Gait Pattern/deviations: Step-through pattern;Step-to pattern;Antalgic     General Gait Details: cues for sequence, incr wt on RLE d/t wrist soreness   Stairs            Wheelchair Mobility    Modified Rankin (Stroke Patients Only)       Balance                                            Cognition Arousal/Alertness: Awake/alert Behavior During Therapy: WFL for tasks assessed/performed Overall Cognitive Status: Within Functional Limits for tasks assessed                                        Exercises Total Joint Exercises Ankle Circles/Pumps: AROM;Both;10 reps Quad Sets: 10 reps;Both;AROM Heel Slides: AAROM;Right;10 reps Hip  ABduction/ADduction: AAROM;Right;10 reps    General Comments        Pertinent Vitals/Pain Pain Assessment: 0-10 Pain Score: 3  Pain Location: R hip (and back) Pain Descriptors / Indicators: Aching Pain Intervention(s): Monitored during session;Ice applied    Home Living                      Prior Function            PT Goals (current goals can now be found in the care plan section) Acute Rehab PT Goals Patient Stated Goal: to return to independence. PT Goal Formulation: With patient Time For Goal Achievement: 08/18/17 Potential to Achieve Goals: Good Progress towards PT goals: Progressing toward goals    Frequency    7X/week      PT Plan Current plan remains appropriate    Co-evaluation              AM-PAC PT "6 Clicks" Daily Activity  Outcome Measure  Difficulty turning over in bed (including adjusting bedclothes, sheets and blankets)?: Unable Difficulty moving from lying on back to sitting on the side of the bed? : Unable Difficulty sitting down on and standing up from a chair with arms (e.g., wheelchair, bedside commode, etc,.)?: Unable Help needed moving to and from a bed  to chair (including a wheelchair)?: A Little Help needed walking in hospital room?: A Little Help needed climbing 3-5 steps with a railing? : A Little 6 Click Score: 12    End of Session Equipment Utilized During Treatment: Gait belt Activity Tolerance: Patient tolerated treatment well Patient left: in bed;with call bell/phone within reach;with family/visitor present   PT Visit Diagnosis: Difficulty in walking, not elsewhere classified (R26.2);Pain Pain - Right/Left: Right Pain - part of body: Hip     Time: 0981-1914 PT Time Calculation (min) (ACUTE ONLY): 23 min  Charges:  $Gait Training: 23-37 mins                    G Codes:          Kingsley Herandez Sep 09, 2017, 4:38 PM

## 2017-08-15 NOTE — Progress Notes (Signed)
Physical Therapy Treatment Patient Details Name: Yolanda Ochoa MRN: [id number] DOB: [birth year] Today's Date: 08/15/2017    History of Present Illness Pt is s/p R DTHA with PMH anxiety, anemia, and foot surgery    PT Comments    Progressing well, feels ready to D/C; HEP reviewed, pt to perform later; all concerns regarding mobility addressed;    Follow Up Recommendations  DC plan and follow up therapy as arranged by surgeon     Equipment Recommendations  Rolling walker with 5" wheels    Recommendations for Other Services       Precautions / Restrictions Precautions Precautions: Fall Restrictions Weight Bearing Restrictions: No Other Position/Activity Restrictions: WBAT    Mobility  Bed Mobility               General bed mobility comments: pt received in chair  Transfers Overall transfer level: Needs assistance Equipment used: Rolling walker (2 wheeled) Transfers: Sit to/from Stand Sit to Stand: Supervision;Modified independent (Device/Increase time)         General transfer comment: self cues  Ambulation/Gait Ambulation/Gait assistance: Supervision;Min guard Ambulation Distance (Feet): 70 Feet Assistive device: Rolling walker (2 wheeled) Gait Pattern/deviations: Step-to pattern;Decreased step length - right;Decreased step length - left     General Gait Details: cues for step length and RW position   Stairs Stairs: Yes   Stair Management: Two rails;Step to pattern;Forwards Number of Stairs: 5 General stair comments: cues for sequence  Wheelchair Mobility    Modified Rankin (Stroke Patients Only)       Balance                                            Cognition Arousal/Alertness: Awake/alert Behavior During Therapy: WFL for tasks assessed/performed Overall Cognitive Status: Within Functional Limits for tasks assessed                                        Exercises      General Comments        Pertinent Vitals/Pain Pain Assessment: Faces Faces Pain Scale: Hurts a little bit Pain Location: R hip Pain Descriptors / Indicators: Sore Pain Intervention(s): Monitored during session;Ice applied    Home Living                      Prior Function            PT Goals (current goals can now be found in the care plan section) Acute Rehab PT Goals Patient Stated Goal: to return to independence. PT Goal Formulation: With patient Time For Goal Achievement: 08/18/17 Potential to Achieve Goals: Good Progress towards PT goals: Progressing toward goals    Frequency    7X/week      PT Plan Current plan remains appropriate    Co-evaluation              AM-PAC PT "6 Clicks" Daily Activity  Outcome Measure  Difficulty turning over in bed (including adjusting bedclothes, sheets and blankets)?: A Little Difficulty moving from lying on back to sitting on the side of the bed? : A Little Difficulty sitting down on and standing up from a chair with arms (e.g., wheelchair, bedside commode, etc,.)?: A Little Help needed moving to and from a bed to chair (  including a wheelchair)?: A Little Help needed walking in hospital room?: A Little Help needed climbing 3-5 steps with a railing? : A Little 6 Click Score: 18    End of Session Equipment Utilized During Treatment: Gait belt Activity Tolerance: Patient tolerated treatment well Patient left: in chair;with call bell/phone within reach;with family/visitor present   PT Visit Diagnosis: Difficulty in walking, not elsewhere classified (R26.2);Pain Pain - Right/Left: Right Pain - part of body: Hip     Time: [id number] PT Time Calculation (min) (ACUTE ONLY): 24 min  Charges:  $Gait Training: 23-37 mins                    G Codes:          Shanta Hartner 08/15/2017, 12:02 PM

## 2017-08-15 NOTE — Progress Notes (Signed)
Discharged from floor via w/c for transport home by car. Belongings & family with pt. No changes in assessment. Brenlynn Fake  

## 2017-08-15 NOTE — Progress Notes (Signed)
   Subjective: 2 Days Post-Op Procedure(s) (LRB): RIGHT TOTAL HIP ARTHROPLASTY ANTERIOR APPROACH (Right) Patient reports pain as mild.    Objective: Vital signs in last 24 hours: Temp:  [98.3 F (36.8 C)-99 F (37.2 C)] 99 F (37.2 C) (10/21 0601) Pulse Rate:  [75-86] 85 (10/21 0601) Resp:  [16] 16 (10/21 0601) BP: (105-124)/(54-72) 123/72 (10/21 0601) SpO2:  [94 %-99 %] 94 % (10/21 0601)  Intake/Output from previous day: 10/20 0701 - 10/21 0700 In: 1440 [P.O.:1440] Out: -  Intake/Output this shift: Total I/O In: 120 [P.O.:120] Out: -    Recent Labs  08/14/17 0600  HGB 10.7*    Recent Labs  08/14/17 0600  WBC 11.4*  RBC 3.45*  HCT 32.4*  PLT 335    Recent Labs  08/14/17 0600  NA 139  K 3.9  CL 103  CO2 26  BUN 7  CREATININE 0.59  GLUCOSE 129*  CALCIUM 8.7*   No results for input(s): LABPT, INR in the last 72 hours.  Neurologically intact No results found.  Assessment/Plan: 2 Days Post-Op Procedure(s) (LRB): RIGHT TOTAL HIP ARTHROPLASTY ANTERIOR APPROACH (Right) Up with therapy, wants to take a shower before discharge today. Office followup with Dr. Magnus IvanBlackman 1 -2 wks. Has oxycodone Rx at home already.   Eldred MangesMark C Yates 08/15/2017, 10:18 AM

## 2017-08-15 NOTE — Care Management Note (Signed)
Case Management Note  Patient Details  Name: Dorita FrayMartha R Cadden MRN: [id number] Date of Birth: 02-Jul-1961  Subjective/Objective:    Right THA                Action/Plan: Discharge Planning: Home Health arranged with [facility]. Pt has RW at home.    Expected Discharge Date:  08/15/17               Expected Discharge Plan:  Home w Home Health Services  In-House Referral:  NA  Discharge planning Services  CM Consult  Post Acute Care Choice:  Home Health Choice offered to:  Patient  DME Arranged:  3-N-1, Walker rolling DME Agency:  [facility]  HH Arranged:  PT HH Agency:  [facility]  Status of Service:  Completed, signed off  If discussed at [employer] Meetings, dates discussed:    Additional Comments:  Elliot CousinShavis, Amedeo Detweiler Ellen, RN 08/15/2017, 5:43 PM

## 2017-08-15 NOTE — Progress Notes (Signed)
   08/15/17 1200  OT Visit Information  Last OT Received On 08/15/17  Assistance Needed +1  History of Present Illness Pt is s/p R DTHA with PMH anxiety, anemia, and foot surgery  Precautions  Precautions Fall  Pain Assessment  Pain Assessment Faces  Faces Pain Scale 2  Pain Location R hip  Pain Descriptors / Indicators Sore  Pain Intervention(s) Repositioned;Monitored during session;Premedicated before session  Cognition  Arousal/Alertness Awake/alert  Behavior During Therapy WFL for tasks assessed/performed  Overall Cognitive Status Within Functional Limits for tasks assessed  ADL  Overall ADL's  Needs assistance/impaired  Grooming Set up;Sitting  Upper Body Bathing Set up;Sitting  Lower Body Bathing Minimal assistance;Sit to/from stand  Upper Body Dressing  Set up;Sitting  Lower Body Dressing Minimal assistance  Lower Body Dressing Details (indicate cue type and reason) shoes  Tub/ Shower Transfer Supervision/safety;Ambulation;3 in 1;Grab bars;Rolling walker  Functional mobility during ADLs Rolling walker;Supervision/safety  General ADL Comments Pt showered.  Restrictions  Weight Bearing Restrictions No  Other Position/Activity Restrictions WBAT  Transfers  Overall transfer level Needs assistance  Equipment used Rolling walker (2 wheeled)  Sit to Stand Supervision;Modified independent (Device/Increase time)  General transfer comment self cues  OT - End of Session  Activity Tolerance Patient limited by fatigue;Treatment limited secondary to medical complications (Comment) (nausea)  Patient left in chair;with call bell/phone within reach;with family/visitor present  Nurse Communication Other (comment) (rash under L arm)  OT Assessment/Plan  OT Plan Discharge plan remains appropriate  OT Visit Diagnosis Muscle weakness (generalized) (M62.81);Unsteadiness on feet (R26.81)  OT Frequency (ACUTE ONLY) Min 2X/week  Follow Up Recommendations No OT follow  up;Supervision/Assistance - 24 hour  AM-PAC OT "6 Clicks" Daily Activity Outcome Measure  Help from another person eating meals? 4  Help from another person taking care of personal grooming? 3  Help from another person toileting, which includes using toliet, bedpan, or urinal? 3  Help from another person bathing (including washing, rinsing, drying)? 3  Help from another person to put on and taking off regular upper body clothing? 4  Help from another person to put on and taking off regular lower body clothing? 3  6 Click Score 20  ADL G Code Conversion CJ  OT Goal Progression  Progress towards OT goals Progressing toward goals  Acute Rehab OT Goals  Patient Stated Goal to return to independence.  OT Goal Formulation With patient  Time For Goal Achievement 08/21/17  Potential to Achieve Goals Good  OT Time Calculation  OT Start Time (ACUTE ONLY) 1119  OT Stop Time (ACUTE ONLY) 1200  OT Time Calculation (min) 41 min  OT General Charges  $OT Visit 1 Visit  OT Treatments  $Self Care/Home Management  38-52 mins  08/15/2017 Martie RoundJulie Khrystyne Arpin, OTR/L Pager: [phone]

## 2017-08-15 NOTE — Progress Notes (Signed)
Occupational Therapy Treatment Patient Details Name: Yolanda Ochoa MRN: [id number] DOB: Mar 13, 1961 Today's Date: 08/15/2017    History of present illness Pt is s/p R DTHA with PMH anxiety, anemia, and foot surgery   OT comments  Pt educated in tub transfer and LB ADL using compensatory strategies and AE. Pt progressing well. Hopeful to go home today with her cousin staying with her.  Follow Up Recommendations  No OT follow up;Supervision/Assistance - 24 hour    Equipment Recommendations       Recommendations for Other Services      Precautions / Restrictions Precautions Precautions: Fall Restrictions Weight Bearing Restrictions: No Other Position/Activity Restrictions: WBAT       Mobility Bed Mobility               General bed mobility comments: pt received in chair  Transfers Overall transfer level: Needs assistance Equipment used: Rolling walker (2 wheeled) Transfers: Sit to/from Stand Sit to Stand: Supervision              Balance                                           ADL either performed or assessed with clinical judgement   ADL Overall ADL's : Needs assistance/impaired               Lower Body Bathing Details (indicate cue type and reason): educated in use of long handled bath sponge     Lower Body Dressing: Minimal assistance Lower Body Dressing Details (indicate cue type and reason): instructed in use of reacher and long shoe horn, to dress R LE first,  Toilet Transfer: Ambulation;RW;BSC;Supervision/safety (over toilet)   Toileting- Clothing Manipulation and Hygiene: Sit to/from stand;Supervision/safety     Tub/Shower Transfer Details (indicate cue type and reason): instructed in technique for tub transfer Functional mobility during ADLs: Rolling walker;Supervision/safety       Vision       Perception     Praxis      Cognition Arousal/Alertness: Awake/alert Behavior During Therapy: WFL for tasks  assessed/performed Overall Cognitive Status: Within Functional Limits for tasks assessed                                          Exercises     Shoulder Instructions       General Comments      Pertinent Vitals/ Pain       Pain Assessment: Faces Faces Pain Scale: Hurts a little bit Pain Location: R hip Pain Descriptors / Indicators: Sore Pain Intervention(s): Monitored during session;Premedicated before session;Ice applied  Home Living                                          Prior Functioning/Environment              Frequency  Min 2X/week        Progress Toward Goals  OT Goals(current goals can now be found in the care plan section)  Progress towards OT goals: Progressing toward goals     Plan Discharge plan remains appropriate    Co-evaluation  AM-PAC PT "6 Clicks" Daily Activity     Outcome Measure   Help from another person eating meals?: None Help from another person taking care of personal grooming?: A Little Help from another person toileting, which includes using toliet, bedpan, or urinal?: A Little Help from another person bathing (including washing, rinsing, drying)?: A Little Help from another person to put on and taking off regular upper body clothing?: None Help from another person to put on and taking off regular lower body clothing?: A Little 6 Click Score: 20    End of Session    OT Visit Diagnosis: Muscle weakness (generalized) (M62.81);Unsteadiness on feet (R26.81)   Activity Tolerance Patient tolerated treatment well   Patient Left in chair;with call bell/phone within reach;with family/visitor present;with nursing/sitter in room   Nurse Communication          Time: [id number] OT Time Calculation (min): 22 min  Charges: OT General Charges $OT Visit: 1 Visit OT Treatments $Self Care/Home Management : 8-22 mins  08/15/2017 Martie RoundJulie Prophet Renwick, OTR/L Pager:  [phone]   Iran PlanasMayberry, [place] BailiffJulie Lynn 08/15/2017, 9:46 AM

## 2017-08-17 ENCOUNTER — Telehealth (INDEPENDENT_AMBULATORY_CARE_PROVIDER_SITE_OTHER): Payer: Self-pay | Admitting: Orthopaedic Surgery

## 2017-08-17 DIAGNOSIS — K219 Gastro-esophageal reflux disease without esophagitis: Secondary | ICD-10-CM | POA: Diagnosis not present

## 2017-08-17 DIAGNOSIS — F419 Anxiety disorder, unspecified: Secondary | ICD-10-CM | POA: Diagnosis not present

## 2017-08-17 DIAGNOSIS — Z471 Aftercare following joint replacement surgery: Secondary | ICD-10-CM | POA: Diagnosis not present

## 2017-08-17 DIAGNOSIS — Z96641 Presence of right artificial hip joint: Secondary | ICD-10-CM | POA: Diagnosis not present

## 2017-08-17 DIAGNOSIS — D649 Anemia, unspecified: Secondary | ICD-10-CM | POA: Diagnosis not present

## 2017-08-17 DIAGNOSIS — J45909 Unspecified asthma, uncomplicated: Secondary | ICD-10-CM | POA: Diagnosis not present

## 2017-08-17 DIAGNOSIS — M1612 Unilateral primary osteoarthritis, left hip: Secondary | ICD-10-CM | POA: Diagnosis not present

## 2017-08-17 DIAGNOSIS — Z8701 Personal history of pneumonia (recurrent): Secondary | ICD-10-CM | POA: Diagnosis not present

## 2017-08-17 DIAGNOSIS — Z9181 History of falling: Secondary | ICD-10-CM | POA: Diagnosis not present

## 2017-08-17 NOTE — Telephone Encounter (Signed)
Patient called advised she got water in the top portion of her bandage. Patient want to know if she need to come in and have the bandage changed. The number to contact patient is [phone]

## 2017-08-17 NOTE — Telephone Encounter (Signed)
Erik/PT called needing script for this patient. Fax or call back would be ok. [phone]

## 2017-08-17 NOTE — Telephone Encounter (Signed)
I told her if it got too wet she could just take bandage off and put dry dressing on it

## 2017-08-18 NOTE — Telephone Encounter (Signed)
Verbal order given on VM 

## 2017-08-19 ENCOUNTER — Inpatient Hospital Stay (INDEPENDENT_AMBULATORY_CARE_PROVIDER_SITE_OTHER): Payer: BLUE CROSS/BLUE SHIELD | Admitting: Physician Assistant

## 2017-08-19 DIAGNOSIS — Z9181 History of falling: Secondary | ICD-10-CM | POA: Diagnosis not present

## 2017-08-19 DIAGNOSIS — Z471 Aftercare following joint replacement surgery: Secondary | ICD-10-CM | POA: Diagnosis not present

## 2017-08-19 DIAGNOSIS — M1612 Unilateral primary osteoarthritis, left hip: Secondary | ICD-10-CM | POA: Diagnosis not present

## 2017-08-19 DIAGNOSIS — K219 Gastro-esophageal reflux disease without esophagitis: Secondary | ICD-10-CM | POA: Diagnosis not present

## 2017-08-19 DIAGNOSIS — D649 Anemia, unspecified: Secondary | ICD-10-CM | POA: Diagnosis not present

## 2017-08-19 DIAGNOSIS — Z96641 Presence of right artificial hip joint: Secondary | ICD-10-CM | POA: Diagnosis not present

## 2017-08-19 DIAGNOSIS — F419 Anxiety disorder, unspecified: Secondary | ICD-10-CM | POA: Diagnosis not present

## 2017-08-19 DIAGNOSIS — Z8701 Personal history of pneumonia (recurrent): Secondary | ICD-10-CM | POA: Diagnosis not present

## 2017-08-19 DIAGNOSIS — J45909 Unspecified asthma, uncomplicated: Secondary | ICD-10-CM | POA: Diagnosis not present

## 2017-08-20 DIAGNOSIS — M1612 Unilateral primary osteoarthritis, left hip: Secondary | ICD-10-CM | POA: Diagnosis not present

## 2017-08-20 DIAGNOSIS — Z96641 Presence of right artificial hip joint: Secondary | ICD-10-CM | POA: Diagnosis not present

## 2017-08-20 DIAGNOSIS — Z9181 History of falling: Secondary | ICD-10-CM | POA: Diagnosis not present

## 2017-08-20 DIAGNOSIS — Z8701 Personal history of pneumonia (recurrent): Secondary | ICD-10-CM | POA: Diagnosis not present

## 2017-08-20 DIAGNOSIS — K219 Gastro-esophageal reflux disease without esophagitis: Secondary | ICD-10-CM | POA: Diagnosis not present

## 2017-08-20 DIAGNOSIS — Z471 Aftercare following joint replacement surgery: Secondary | ICD-10-CM | POA: Diagnosis not present

## 2017-08-20 DIAGNOSIS — F419 Anxiety disorder, unspecified: Secondary | ICD-10-CM | POA: Diagnosis not present

## 2017-08-20 DIAGNOSIS — J45909 Unspecified asthma, uncomplicated: Secondary | ICD-10-CM | POA: Diagnosis not present

## 2017-08-20 DIAGNOSIS — D649 Anemia, unspecified: Secondary | ICD-10-CM | POA: Diagnosis not present

## 2017-08-24 DIAGNOSIS — F419 Anxiety disorder, unspecified: Secondary | ICD-10-CM | POA: Diagnosis not present

## 2017-08-24 DIAGNOSIS — Z9181 History of falling: Secondary | ICD-10-CM | POA: Diagnosis not present

## 2017-08-24 DIAGNOSIS — D649 Anemia, unspecified: Secondary | ICD-10-CM | POA: Diagnosis not present

## 2017-08-24 DIAGNOSIS — Z471 Aftercare following joint replacement surgery: Secondary | ICD-10-CM | POA: Diagnosis not present

## 2017-08-24 DIAGNOSIS — Z96641 Presence of right artificial hip joint: Secondary | ICD-10-CM | POA: Diagnosis not present

## 2017-08-24 DIAGNOSIS — Z8701 Personal history of pneumonia (recurrent): Secondary | ICD-10-CM | POA: Diagnosis not present

## 2017-08-24 DIAGNOSIS — J45909 Unspecified asthma, uncomplicated: Secondary | ICD-10-CM | POA: Diagnosis not present

## 2017-08-24 DIAGNOSIS — M1612 Unilateral primary osteoarthritis, left hip: Secondary | ICD-10-CM | POA: Diagnosis not present

## 2017-08-24 DIAGNOSIS — K219 Gastro-esophageal reflux disease without esophagitis: Secondary | ICD-10-CM | POA: Diagnosis not present

## 2017-08-26 ENCOUNTER — Ambulatory Visit (INDEPENDENT_AMBULATORY_CARE_PROVIDER_SITE_OTHER): Payer: BLUE CROSS/BLUE SHIELD | Admitting: Physician Assistant

## 2017-08-26 ENCOUNTER — Encounter (INDEPENDENT_AMBULATORY_CARE_PROVIDER_SITE_OTHER): Payer: Self-pay | Admitting: Physician Assistant

## 2017-08-26 DIAGNOSIS — Z96641 Presence of right artificial hip joint: Secondary | ICD-10-CM

## 2017-08-26 MED ORDER — CLINDAMYCIN HCL 300 MG PO CAPS: ORAL_CAPSULE | ORAL | 0 refills | Status: DC

## 2017-08-26 NOTE — Progress Notes (Signed)
[name] returns today 2 weeks status post right total hip arthroplasty.  She is overall doing very well in regards to the right hip.  She has no pain in the groin.  She has some stiffness whenever she first gets up to ambulate.  She also and notes that she has some numbness around the incision site.  She has had no fevers chills shortness of breath chest pain.  She is taken to some tramadol for pain.  Physical exam: Right hip good range of motion without significant pain.  Surgical incisions healing well.  No signs of infection or seroma .She ambulates with the use of a cane in the nonantalgic gait.  Right calf supple nontender  Impression: Status post right total hip arthroplasty 08/13/2017  Plan: She will work on scar tissue mobilization.  Continue to work on strengthening of the right hip.  We will see her back in 1 month to check progress or lack of.

## 2017-08-26 NOTE — Discharge Summary (Signed)
Patient ID: Yolanda FrayMartha R Wente MRN: [id number] DOB/AGE: 1961-01-13 56 y.o.  Admit date: 08/13/2017 Discharge date: 08/26/2017  Admission Diagnoses:  Principal Problem:   Unilateral primary osteoarthritis, right hip Active Problems:   Status post total replacement of right hip   Discharge Diagnoses:  Same  Past Medical History:  Diagnosis Date  . Anemia    hx of  . Anxiety   . Arthritis   . Asthma   . GERD (gastroesophageal reflux disease)    hx of no meds  . Pneumonia    walking pneumonia    Surgeries: Procedure(s): RIGHT TOTAL HIP ARTHROPLASTY ANTERIOR APPROACH on 08/13/2017   Consultants:   Discharged Condition: Improved  Hospital Course: Yolanda Ochoa is an 56 y.o. female who was admitted 08/13/2017 for operative treatment ofUnilateral primary osteoarthritis, right hip. Patient has severe unremitting pain that affects sleep, daily activities, and work/hobbies. After pre-op clearance the patient was taken to the operating room on 08/13/2017 and underwent  Procedure(s): RIGHT TOTAL HIP ARTHROPLASTY ANTERIOR APPROACH.    Patient was given perioperative antibiotics:  Anti-infectives    Start     Dose/Rate Route Frequency Ordered Stop   08/13/17 1800  clindamycin (CLEOCIN) IVPB 600 mg     600 mg 100 mL/hr over 30 Minutes Intravenous Every 6 hours 08/13/17 1618 08/14/17 0001   08/13/17 1003  clindamycin (CLEOCIN) 900 MG/50ML IVPB    Comments:  Wylene SimmerShepherd, Karen   : cabinet override      08/13/17 1003 08/13/17 1149   08/13/17 0956  clindamycin (CLEOCIN) IVPB 900 mg     900 mg 100 mL/hr over 30 Minutes Intravenous On call to O.R. 08/13/17 0956 08/13/17 1149       Patient was given sequential compression devices, early ambulation, and chemoprophylaxis to prevent DVT.  Patient benefited maximally from hospital stay and there were no complications.    Recent vital signs: No data found.    Recent laboratory studies: No results for input(s): WBC, HGB, HCT, PLT, NA, K,  CL, CO2, BUN, CREATININE, GLUCOSE, INR, CALCIUM in the last 72 hours.  Invalid input(s): PT, 2   Discharge Medications:   Allergies as of 08/15/2017      Reactions   Codeine Rash   Penicillins Rash      Medication List    STOP taking these medications   meloxicam 15 MG tablet Commonly known as:  MOBIC   meloxicam 7.5 MG tablet Commonly known as:  MOBIC   naproxen sodium 220 MG tablet Commonly known as:  ALEVE     TAKE these medications   acetaminophen 500 MG tablet Commonly known as:  TYLENOL Take 1,000 mg by mouth every 6 (six) hours as needed for moderate pain or headache.   aspirin 81 MG chewable tablet Chew 1 tablet (81 mg total) by mouth 2 (two) times daily.   BLUE-EMU SUPER STRENGTH EX Apply 1 application topically as needed (for pain).   buPROPion 150 MG 24 hr tablet Commonly known as:  WELLBUTRIN XL Take 150 mg by mouth daily.   CALCIUM PO Take 1 tablet by mouth daily.   fluticasone 50 MCG/ACT nasal spray Commonly known as:  FLONASE Place 2 sprays into both nostrils daily as needed for allergies or rhinitis.   Fluticasone-Salmeterol 250-50 MCG/DOSE Aepb Commonly known as:  ADVAIR Inhale 2 puffs into the lungs 2 (two) times daily as needed (for shortnes of breath).   lidocaine 4 % external solution Commonly known as:  XYLOCAINE Apply 5 mLs topically as  needed for mild pain.   oxyCODONE-acetaminophen 5-325 MG tablet Commonly known as:  PERCOCET/ROXICET Take 1-2 tablets by mouth every 4 (four) hours as needed for severe pain.   tiZANidine 4 MG tablet Commonly known as:  ZANAFLEX Take 1 tablet (4 mg total) by mouth every 8 (eight) hours as needed for muscle spasms.   traMADol 50 MG tablet Commonly known as:  ULTRAM Take one tablet every 6 hours as needed for pain, not to exceed 4 a day. What changed:  how much to take  how to take this  when to take this  reasons to take this  additional instructions   TURMERIC PO Take 1 capsule by  mouth 2 (two) times daily.   Vitamin D3 2000 units Tabs Take 2,000 Units by mouth daily.       Diagnostic Studies: Dg Pelvis Portable  Result Date: 08/13/2017 CLINICAL DATA:  56 year old female post right hip replacement. Subsequent encounter. EXAM: PORTABLE PELVIS 1-2 VIEWS COMPARISON:  Intraoperative exam 08/13/2017. Preoperative exam 08/03/2017. FINDINGS: Right total hip replacement appears in satisfactory position on this single projection. There may be slight fragmentation of the right lateral acetabular osteophyte versus crossing artifact. Prominent left hip joint degenerative changes. IMPRESSION: Right total hip replacement appears in satisfactory position on this single projection. There may be slight fragmentation of the right lateral acetabular osteophyte versus crossing artifact. Electronically Signed   By: Lacy Duverney M.D.   On: 08/13/2017 14:02   Dg C-arm 1-60 Min-no Report  Result Date: 08/13/2017 Fluoroscopy was utilized by the requesting physician.  No radiographic interpretation.   Dg Hip Operative Unilat W Or W/o Pelvis Right  Result Date: 08/13/2017 CLINICAL DATA:  Right hip arthroplasty. EXAM: OPERATIVE right HIP (WITH PELVIS IF PERFORMED) 1 VIEWS TECHNIQUE: Fluoroscopic spot image(s) were submitted for interpretation post-operatively. COMPARISON:  None. FINDINGS: Three spot images obtained portably in the operating room via C-arm radiography shows resection of the right femoral head and neck with placement of a right total hip arthroplasty device. Hardware components are in anatomic alignment. No complications. IMPRESSION: 1. Status post right total hip arthroplasty Electronically Signed   By: Signa Kell M.D.   On: 08/13/2017 13:19    Disposition: 01-Home or Self Care    Follow-up Information    [facility] up.   Specialty:  Home Health Services Why:  Home Health Physical Therapy- will call you to arrange initial visit Contact information: [address] [place] [address] [phone]        Kathryne Hitch, MD Follow up in 2 week(s).   Specialty:  Orthopedic Surgery Contact information: [address] [place] [address] [phone]            Signed: Richardean Canal 08/26/2017, 10:10 AM

## 2017-08-27 DIAGNOSIS — J45909 Unspecified asthma, uncomplicated: Secondary | ICD-10-CM | POA: Diagnosis not present

## 2017-08-27 DIAGNOSIS — Z471 Aftercare following joint replacement surgery: Secondary | ICD-10-CM | POA: Diagnosis not present

## 2017-08-27 DIAGNOSIS — K219 Gastro-esophageal reflux disease without esophagitis: Secondary | ICD-10-CM | POA: Diagnosis not present

## 2017-08-27 DIAGNOSIS — Z9181 History of falling: Secondary | ICD-10-CM | POA: Diagnosis not present

## 2017-08-27 DIAGNOSIS — M1612 Unilateral primary osteoarthritis, left hip: Secondary | ICD-10-CM | POA: Diagnosis not present

## 2017-08-27 DIAGNOSIS — Z96641 Presence of right artificial hip joint: Secondary | ICD-10-CM | POA: Diagnosis not present

## 2017-08-27 DIAGNOSIS — D649 Anemia, unspecified: Secondary | ICD-10-CM | POA: Diagnosis not present

## 2017-08-27 DIAGNOSIS — Z8701 Personal history of pneumonia (recurrent): Secondary | ICD-10-CM | POA: Diagnosis not present

## 2017-08-27 DIAGNOSIS — F419 Anxiety disorder, unspecified: Secondary | ICD-10-CM | POA: Diagnosis not present

## 2017-09-03 ENCOUNTER — Ambulatory Visit (INDEPENDENT_AMBULATORY_CARE_PROVIDER_SITE_OTHER): Payer: BLUE CROSS/BLUE SHIELD | Admitting: Physical Medicine and Rehabilitation

## 2017-09-03 ENCOUNTER — Ambulatory Visit (INDEPENDENT_AMBULATORY_CARE_PROVIDER_SITE_OTHER): Payer: Self-pay

## 2017-09-03 ENCOUNTER — Encounter (INDEPENDENT_AMBULATORY_CARE_PROVIDER_SITE_OTHER): Payer: Self-pay | Admitting: Physical Medicine and Rehabilitation

## 2017-09-03 DIAGNOSIS — M1612 Unilateral primary osteoarthritis, left hip: Secondary | ICD-10-CM | POA: Diagnosis not present

## 2017-09-03 DIAGNOSIS — M25552 Pain in left hip: Secondary | ICD-10-CM | POA: Diagnosis not present

## 2017-09-03 MED ORDER — TRIAMCINOLONE ACETONIDE 40 MG/ML IJ SUSP
80.0000 mg | INTRAMUSCULAR | Status: AC | PRN
  Administered 2017-09-03: 80 mg via INTRA_ARTICULAR

## 2017-09-03 MED ORDER — BUPIVACAINE HCL 0.5 % IJ SOLN
3.0000 mL | INTRAMUSCULAR | Status: AC | PRN
  Administered 2017-09-03: 3 mL via INTRA_ARTICULAR

## 2017-09-03 NOTE — Progress Notes (Signed)
Yolanda Ochoa - 56 y.o. female MRN [id number]  Date of birth: 10/09/1961  Office Visit Note: Visit Date: 09/03/2017 PCP: Yolanda Fickamachandran, Ajith, MD Referred by: Yolanda Fickamachandran, Ajith, MD  Subjective: Chief Complaint  Patient presents with  . Left Hip - Pain   HPI: Yolanda Ochoa is a pleasant 56 year old female that we have seen in's.  She recently had a right total hip replacement is doing well with that.  She continues to have left hip and groin pain worse with movement.  She is scheduled to have a left total hip replacement in December.  We are going to complete a diagnostic and hopefully therapeutic left intra-articular hip injection with fluoroscopic guidance.    ROS Otherwise per HPI.  Assessment & Plan: Visit Diagnoses:  1. Pain in left hip   2. Unilateral primary osteoarthritis, left hip     Plan: Findings:  Intra-articular left hip injection with fluoroscopic guidance.  Patient did have relief during the anesthetic phase.    Meds & Orders: No orders of the defined types were placed in this encounter.   Orders Placed This Encounter  Procedures  . Large Joint Inj: L hip joint  . XR C-ARM NO REPORT    Follow-up: Return in about 4 weeks (around 10/01/2017) for Dr. Magnus IvanBlackman.   Procedures: Large Joint Inj: L hip joint on 09/03/2017 10:55 AM Indications: diagnostic evaluation and pain Details: 22 G 3.5 in needle, fluoroscopy-guided anterior approach  Arthrogram: No  Medications: 80 mg triamcinolone acetonide 40 MG/ML; 3 mL bupivacaine 0.5 % Outcome: tolerated well, no immediate complications  There was excellent flow of contrast producing a partial arthrogram of the hip. The patient did have relief of symptoms during the anesthetic phase of the injection. Procedure, treatment alternatives, risks and benefits explained, specific risks discussed. Consent was given by the patient. Immediately prior to procedure a time out was called to verify the correct patient, procedure,  equipment, support staff and site/side marked as required. Patient was prepped and draped in the usual sterile fashion.      No notes on file   Clinical History: No specialty comments available.  She reports that  has never smoked. she has never used smokeless tobacco. No results for input(s): HGBA1C, LABURIC in the last 8760 hours.  Objective:  VS:  HT:    WT:   BMI:     BP:   HR: bpm  TEMP: ( )  RESP:  Physical Exam  Musculoskeletal:  Painful range of motion of the left hip.  Patient does ambulate with a cane.    Ortho Exam Imaging: No results found.  Past Medical/Family/Surgical/Social History: Medications & Allergies reviewed per EMR Patient Active Problem List   Diagnosis Date Noted  . Unilateral primary osteoarthritis, right hip 08/13/2017  . Status post total replacement of right hip 08/13/2017  . Unilateral primary osteoarthritis, left hip 02/22/2017  . Pain of left hip joint 02/22/2017   Past Medical History:  Diagnosis Date  . Anemia    hx of  . Anxiety   . Arthritis   . Asthma   . GERD (gastroesophageal reflux disease)    hx of no meds  . Pneumonia    walking pneumonia   History reviewed. No pertinent family history. Past Surgical History:  Procedure Laterality Date  . fatty tumor removed     back of neck and R shoulder  . FOOT SURGERY     bil bunion and hammer toe repair  . NASAL SINUS SURGERY    .  TONSILLECTOMY     Social History   Occupational History  . Not on file  Tobacco Use  . Smoking status: Never Smoker  . Smokeless tobacco: Never Used  Substance and Sexual Activity  . Alcohol use: Yes    Alcohol/week: 0.6 oz    Types: 1 Glasses of wine per week    Comment: socially  . Drug use: No  . Sexual activity: Not Currently

## 2017-09-07 ENCOUNTER — Other Ambulatory Visit (INDEPENDENT_AMBULATORY_CARE_PROVIDER_SITE_OTHER): Payer: Self-pay | Admitting: Orthopaedic Surgery

## 2017-09-07 NOTE — Telephone Encounter (Signed)
Please advise 

## 2017-09-13 DIAGNOSIS — H10413 Chronic giant papillary conjunctivitis, bilateral: Secondary | ICD-10-CM | POA: Diagnosis not present

## 2017-09-27 ENCOUNTER — Other Ambulatory Visit (INDEPENDENT_AMBULATORY_CARE_PROVIDER_SITE_OTHER): Payer: Self-pay | Admitting: Physician Assistant

## 2017-09-30 ENCOUNTER — Other Ambulatory Visit (INDEPENDENT_AMBULATORY_CARE_PROVIDER_SITE_OTHER): Payer: Self-pay

## 2017-09-30 ENCOUNTER — Telehealth (INDEPENDENT_AMBULATORY_CARE_PROVIDER_SITE_OTHER): Payer: Self-pay | Admitting: Orthopaedic Surgery

## 2017-09-30 MED ORDER — TRAMADOL HCL 50 MG PO TABS: 50.0000 mg | ORAL_TABLET | Freq: Three times a day (TID) | ORAL | 0 refills | Status: DC | PRN

## 2017-09-30 NOTE — Telephone Encounter (Signed)
OK to refill this.  1-2 every 8-12 hours as needed, #60

## 2017-09-30 NOTE — Telephone Encounter (Signed)
Told her to call me back with pharmacy info and I will call in for her

## 2017-09-30 NOTE — Telephone Encounter (Signed)
Called into pharmacy

## 2017-09-30 NOTE — Telephone Encounter (Signed)
Please advise 

## 2017-09-30 NOTE — Telephone Encounter (Signed)
Patient called asking for a refill on Tramadol she only has one pill left and would like it sent in to her pharmacy on [address] CB # [phone]

## 2017-09-30 NOTE — Telephone Encounter (Signed)
Patient called asked if the Rx can be sent to [facility] on [address]       The number to contact patient is [phone]

## 2017-10-01 ENCOUNTER — Other Ambulatory Visit (INDEPENDENT_AMBULATORY_CARE_PROVIDER_SITE_OTHER): Payer: Self-pay

## 2017-10-04 ENCOUNTER — Other Ambulatory Visit (HOSPITAL_COMMUNITY): Payer: Self-pay | Admitting: *Deleted

## 2017-10-04 ENCOUNTER — Ambulatory Visit (INDEPENDENT_AMBULATORY_CARE_PROVIDER_SITE_OTHER): Payer: BLUE CROSS/BLUE SHIELD | Admitting: Physician Assistant

## 2017-10-04 ENCOUNTER — Encounter (HOSPITAL_COMMUNITY): Admission: RE | Admit: 2017-10-04 | Payer: BLUE CROSS/BLUE SHIELD | Source: Ambulatory Visit

## 2017-10-04 NOTE — Patient Instructions (Addendum)
Yolanda Ochoa  10/04/2017   Your procedure is scheduled on: 10-08-16  [facility]    Report to admitting at 7:00 AM    Call this number if you have problems the morning of surgery [phone]   Remember: ONLY 1 PERSON MAY GO WITH YOU TO SHORT STAY TO GET  READY MORNING OF YOUR SURGERY.  Do not eat food or drink liquids :After Midnight.     Take these medicines the morning of surgery with A SIP OF WATER: ADVAIR IF NEEDED (MAY BRING TO HOSPITAL) , BUPROPION [facility](WELLBUTRIN)                               You may not have any metal on your body including hair pins and              piercings  Do not wear jewelry, make-up, lotions, powders or perfumes, deodorant             Do not wear nail polish.  Do not shave  48 hours prior to surgery.     Do not bring valuables to the hospital. [facility] IS NOT             RESPONSIBLE   FOR VALUABLES.  Contacts, dentures or bridgework may not be worn into surgery.  Leave suitcase in the car. After surgery it may be brought to your room.                  Please read over the following fact sheets you were given: _____________________________________________________________________            [facility] - Preparing [facility] Before surgery, you can play an important role.  Because skin is not sterile, your skin needs to be as free of germs as possible.  You can reduce the number of germs on your skin by washing with CHG (chlorahexidine gluconate) soap before surgery.  CHG is an antiseptic cleaner which kills germs and bonds with the skin to continue killing germs even after washing. Please DO NOT use if you have an allergy to CHG or antibacterial soaps.  If your skin becomes reddened/irritated stop using the CHG and inform your nurse when you arrive at Short Stay. Do not shave (including legs and underarms) for at least 48 hours prior to the first CHG shower.  You may shave your face/neck. Please  follow these instructions carefully:  1.  Shower with CHG Soap the night before surgery and the  morning of Surgery.  2.  If you choose to wash your hair, wash your hair first as usual with your  normal  shampoo.  3.  After you shampoo, rinse your hair and body thoroughly to remove the  shampoo.                           4.  Use CHG as you would any other liquid soap.  You can apply chg directly  to the skin and wash                       Gently with a scrungie or clean washcloth.  5.  Apply the CHG Soap to your body ONLY FROM THE NECK DOWN.   Do not use  on face/ open                           Wound or open sores. Avoid contact with eyes, ears mouth and genitals (private parts).                       Wash face,  Genitals (private parts) with your normal soap.             6.  Wash thoroughly, paying special attention to the area where your surgery  will be performed.  7.  Thoroughly rinse your body with warm water from the neck down.  8.  DO NOT shower/wash with your normal soap after using and rinsing off  the CHG Soap.                9.  Pat yourself dry with a clean towel.            10.  Wear clean pajamas.            11.  Place clean sheets on your bed the night of your first shower and do not  sleep with pets. Day of Surgery : Do not apply any lotions/deodorants the morning of surgery.  Please wear clean clothes to the hospital/surgery center.  FAILURE TO FOLLOW THESE INSTRUCTIONS MAY RESULT IN THE CANCELLATION OF YOUR SURGERY PATIENT SIGNATURE_________________________________  NURSE SIGNATURE__________________________________  ________________________________________________________________________   Adam Phenix  An incentive spirometer is a tool that can help keep your lungs clear and active. This tool measures how well you are filling your lungs with each breath. Taking long deep breaths may help reverse or decrease the chance of developing breathing (pulmonary) problems  (especially infection) following:  A long period of time when you are unable to move or be active. BEFORE THE PROCEDURE   If the spirometer includes an indicator to show your best effort, your nurse or respiratory therapist will set it to a desired goal.  If possible, sit up straight or lean slightly forward. Try not to slouch.  Hold the incentive spirometer in an upright position. INSTRUCTIONS FOR USE  1. Sit on the edge of your bed if possible, or sit up as far as you can in bed or on a chair. 2. Hold the incentive spirometer in an upright position. 3. Breathe out normally. 4. Place the mouthpiece in your mouth and seal your lips tightly around it. 5. Breathe in slowly and as deeply as possible, raising the piston or the ball toward the top of the column. 6. Hold your breath for 3-5 seconds or for as long as possible. Allow the piston or ball to fall to the bottom of the column. 7. Remove the mouthpiece from your mouth and breathe out normally. 8. Rest for a few seconds and repeat Steps 1 through 7 at least 10 times every 1-2 hours when you are awake. Take your time and take a few normal breaths between deep breaths. 9. The spirometer may include an indicator to show your best effort. Use the indicator as a goal to work toward during each repetition. 10. After each set of 10 deep breaths, practice coughing to be sure your lungs are clear. If you have an incision (the cut made at the time of surgery), support your incision when coughing by placing a pillow or rolled up towels firmly against it. Once you are able to get out of bed, walk around  indoors and cough well. You may stop using the incentive spirometer when instructed by your caregiver.  RISKS AND COMPLICATIONS  Take your time so you do not get dizzy or light-headed.  If you are in pain, you may need to take or ask for pain medication before doing incentive spirometry. It is harder to take a deep breath if you are having  pain. AFTER USE  Rest and breathe slowly and easily.  It can be helpful to keep track of a log of your progress. Your caregiver can provide you with a simple table to help with this. If you are using the spirometer at home, follow these instructions: SEEK MEDICAL CARE IF:   You are having difficultly using the spirometer.  You have trouble using the spirometer as often as instructed.  Your pain medication is not giving enough relief while using the spirometer.  You develop fever of 100.5 F (38.1 C) or higher. SEEK IMMEDIATE MEDICAL CARE IF:   You cough up bloody sputum that had not been present before.  You develop fever of 102 F (38.9 C) or greater.  You develop worsening pain at or near the incision site. MAKE SURE YOU:   Understand these instructions.  Will watch your condition.  Will get help right away if you are not doing well or get worse. Document Released: 02/22/2007 Document Revised: 01/04/2012 Document Reviewed: 04/25/2007 ExitCare Patient Information 2014 [employer], [place].   ________________________________________________________________________  WHAT IS A BLOOD TRANSFUSION? Blood Transfusion Information  A transfusion is the replacement of blood or some of its parts. Blood is made up of multiple cells which provide different functions.  Red blood cells carry oxygen and are used for blood loss replacement.  White blood cells fight against infection.  Platelets control bleeding.  Plasma helps clot blood.  Other blood products are available for specialized needs, such as hemophilia or other clotting disorders. BEFORE THE TRANSFUSION  Who gives blood for transfusions?   Healthy volunteers who are fully evaluated to make sure their blood is safe. This is blood bank blood. Transfusion therapy is the safest it has ever been in the practice of medicine. Before blood is taken from a donor, a complete history is taken to make sure that person has no history  of diseases nor engages in risky social behavior (examples are intravenous drug use or sexual activity with multiple partners). The donor's travel history is screened to minimize risk of transmitting infections, such as malaria. The donated blood is tested for signs of infectious diseases, such as HIV and hepatitis. The blood is then tested to be sure it is compatible with you in order to minimize the chance of a transfusion reaction. If you or a relative donates blood, this is often done in anticipation of surgery and is not appropriate for emergency situations. It takes many days to process the donated blood. RISKS AND COMPLICATIONS Although transfusion therapy is very safe and saves many lives, the main dangers of transfusion include:   Getting an infectious disease.  Developing a transfusion reaction. This is an allergic reaction to something in the blood you were given. Every precaution is taken to prevent this. The decision to have a blood transfusion has been considered carefully by your caregiver before blood is given. Blood is not given unless the benefits outweigh the risks. AFTER THE TRANSFUSION  Right after receiving a blood transfusion, you will usually feel much better and more energetic. This is especially true if your red blood cells have gotten  low (anemic). The transfusion raises the level of the red blood cells which carry oxygen, and this usually causes an energy increase.  The nurse administering the transfusion will monitor you carefully for complications. HOME CARE INSTRUCTIONS  No special instructions are needed after a transfusion. You may find your energy is better. Speak with your caregiver about any limitations on activity for underlying diseases you may have. SEEK MEDICAL CARE IF:   Your condition is not improving after your transfusion.  You develop redness or irritation at the intravenous (IV) site. SEEK IMMEDIATE MEDICAL CARE IF:  Any of the following symptoms  occur over the next 12 hours:  Shaking chills.  You have a temperature by mouth above 102 F (38.9 C), not controlled by medicine.  Chest, back, or muscle pain.  People around you feel you are not acting correctly or are confused.  Shortness of breath or difficulty breathing.  Dizziness and fainting.  You get a rash or develop hives.  You have a decrease in urine output.  Your urine turns a dark color or changes to pink, red, or brown. Any of the following symptoms occur over the next 10 days:  You have a temperature by mouth above 102 F (38.9 C), not controlled by medicine.  Shortness of breath.  Weakness after normal activity.  The white part of the eye turns yellow (jaundice).  You have a decrease in the amount of urine or are urinating less often.  Your urine turns a dark color or changes to pink, red, or brown. Document Released: 10/09/2000 Document Revised: 01/04/2012 Document Reviewed: 05/28/2008 [facility] Patient Information 2014 [place].  _______________________________________________________________________

## 2017-10-05 ENCOUNTER — Encounter (HOSPITAL_COMMUNITY): Payer: Self-pay

## 2017-10-05 ENCOUNTER — Encounter (HOSPITAL_COMMUNITY)
Admission: RE | Admit: 2017-10-05 | Discharge: 2017-10-05 | Disposition: A | Discharge status: Home / Self Care | Payer: BLUE CROSS/BLUE SHIELD | Source: Ambulatory Visit | Attending: Orthopaedic Surgery | Admitting: Orthopaedic Surgery

## 2017-10-05 ENCOUNTER — Other Ambulatory Visit: Payer: Self-pay

## 2017-10-05 DIAGNOSIS — Z96642 Presence of left artificial hip joint: Secondary | ICD-10-CM | POA: Diagnosis not present

## 2017-10-05 DIAGNOSIS — M25552 Pain in left hip: Secondary | ICD-10-CM | POA: Diagnosis not present

## 2017-10-05 DIAGNOSIS — K219 Gastro-esophageal reflux disease without esophagitis: Secondary | ICD-10-CM | POA: Diagnosis not present

## 2017-10-05 DIAGNOSIS — Z96641 Presence of right artificial hip joint: Secondary | ICD-10-CM | POA: Diagnosis not present

## 2017-10-05 DIAGNOSIS — M1612 Unilateral primary osteoarthritis, left hip: Secondary | ICD-10-CM | POA: Diagnosis not present

## 2017-10-05 DIAGNOSIS — Z471 Aftercare following joint replacement surgery: Secondary | ICD-10-CM | POA: Diagnosis not present

## 2017-10-05 DIAGNOSIS — Z01812 Encounter for preprocedural laboratory examination: Secondary | ICD-10-CM | POA: Insufficient documentation

## 2017-10-05 LAB — CBC
HCT: 39.6 % (ref 36.0–46.0)
HEMOGLOBIN: 13 g/dL (ref 12.0–15.0)
MCH: 31 pg (ref 26.0–34.0)
MCHC: 32.8 g/dL (ref 30.0–36.0)
MCV: 94.5 fL (ref 78.0–100.0)
PLATELETS: 331 10*3/uL (ref 150–400)
RBC: 4.19 MIL/uL (ref 3.87–5.11)
RDW: 12.5 % (ref 11.5–15.5)
WBC: 7.6 10*3/uL (ref 4.0–10.5)

## 2017-10-05 LAB — SURGICAL PCR SCREEN
MRSA, PCR: NEGATIVE
Staphylococcus aureus: NEGATIVE

## 2017-10-08 ENCOUNTER — Inpatient Hospital Stay (HOSPITAL_COMMUNITY): Payer: BLUE CROSS/BLUE SHIELD

## 2017-10-08 ENCOUNTER — Encounter (HOSPITAL_COMMUNITY): Payer: Self-pay | Admitting: Certified Registered Nurse Anesthetist

## 2017-10-08 ENCOUNTER — Encounter (HOSPITAL_COMMUNITY)
Admission: RE | Disposition: A | Discharge status: Home / Self Care | Payer: Self-pay | Source: Ambulatory Visit | Attending: Orthopaedic Surgery

## 2017-10-08 ENCOUNTER — Inpatient Hospital Stay (HOSPITAL_COMMUNITY): Payer: BLUE CROSS/BLUE SHIELD | Admitting: Certified Registered Nurse Anesthetist

## 2017-10-08 ENCOUNTER — Inpatient Hospital Stay (HOSPITAL_COMMUNITY)
Admission: RE | Admit: 2017-10-08 | Discharge: 2017-10-10 | DRG: 470 | Disposition: A | Discharge status: Home / Self Care | Payer: BLUE CROSS/BLUE SHIELD | Source: Ambulatory Visit | Attending: Orthopaedic Surgery | Admitting: Orthopaedic Surgery

## 2017-10-08 DIAGNOSIS — M1612 Unilateral primary osteoarthritis, left hip: Secondary | ICD-10-CM | POA: Diagnosis not present

## 2017-10-08 DIAGNOSIS — Z96642 Presence of left artificial hip joint: Secondary | ICD-10-CM

## 2017-10-08 DIAGNOSIS — K219 Gastro-esophageal reflux disease without esophagitis: Secondary | ICD-10-CM | POA: Diagnosis present

## 2017-10-08 DIAGNOSIS — M25552 Pain in left hip: Secondary | ICD-10-CM | POA: Diagnosis not present

## 2017-10-08 DIAGNOSIS — Z471 Aftercare following joint replacement surgery: Secondary | ICD-10-CM | POA: Diagnosis not present

## 2017-10-08 DIAGNOSIS — Z96641 Presence of right artificial hip joint: Secondary | ICD-10-CM | POA: Diagnosis present

## 2017-10-08 DIAGNOSIS — Z419 Encounter for procedure for purposes other than remedying health state, unspecified: Secondary | ICD-10-CM

## 2017-10-08 HISTORY — PX: TOTAL HIP ARTHROPLASTY: SHX124

## 2017-10-08 LAB — TYPE AND SCREEN
ABO/RH(D): A POS
ANTIBODY SCREEN: NEGATIVE

## 2017-10-08 SURGERY — ARTHROPLASTY, HIP, TOTAL, ANTERIOR APPROACH
Anesthesia: Spinal | Site: Hip | Laterality: Left

## 2017-10-08 MED ORDER — MIDAZOLAM HCL 5 MG/5ML IJ SOLN
INTRAMUSCULAR | Status: DC | PRN
  Administered 2017-10-08: 2 mg via INTRAVENOUS

## 2017-10-08 MED ORDER — BUPIVACAINE IN DEXTROSE 0.75-8.25 % IT SOLN
INTRATHECAL | Status: DC | PRN
  Administered 2017-10-08: 13.5 mg via INTRATHECAL

## 2017-10-08 MED ORDER — FLUTICASONE PROPIONATE 50 MCG/ACT NA SUSP: 2.0000 | Freq: Every day | NASAL | Status: DC | PRN

## 2017-10-08 MED ORDER — ONDANSETRON HCL 4 MG/2ML IJ SOLN
INTRAMUSCULAR | Status: AC
  Filled 2017-10-08: qty 2

## 2017-10-08 MED ORDER — DEXAMETHASONE SODIUM PHOSPHATE 10 MG/ML IJ SOLN
INTRAMUSCULAR | Status: AC
  Filled 2017-10-08: qty 1

## 2017-10-08 MED ORDER — CLINDAMYCIN PHOSPHATE 900 MG/50ML IV SOLN
INTRAVENOUS | Status: AC
  Filled 2017-10-08: qty 50

## 2017-10-08 MED ORDER — FENTANYL CITRATE (PF) 100 MCG/2ML IJ SOLN
INTRAMUSCULAR | Status: AC
  Filled 2017-10-08: qty 2

## 2017-10-08 MED ORDER — TRANEXAMIC ACID 1000 MG/10ML IV SOLN
1000.0000 mg | INTRAVENOUS | Status: AC
  Administered 2017-10-08: 1000 mg via INTRAVENOUS
  Filled 2017-10-08: qty 1100

## 2017-10-08 MED ORDER — BUPROPION HCL ER (XL) 150 MG PO TB24
150.0000 mg | ORAL_TABLET | Freq: Every day | ORAL | Status: DC
  Administered 2017-10-09 – 2017-10-10 (×2): 150 mg via ORAL
  Filled 2017-10-08 (×3): qty 1

## 2017-10-08 MED ORDER — ONDANSETRON HCL 4 MG PO TABS: 4.0000 mg | ORAL_TABLET | Freq: Four times a day (QID) | ORAL | Status: DC | PRN

## 2017-10-08 MED ORDER — DEXAMETHASONE SODIUM PHOSPHATE 4 MG/ML IJ SOLN
INTRAMUSCULAR | Status: DC | PRN
  Administered 2017-10-08: 10 mg via INTRAVENOUS

## 2017-10-08 MED ORDER — PROMETHAZINE HCL 25 MG/ML IJ SOLN: 6.2500 mg | INTRAMUSCULAR | Status: DC | PRN

## 2017-10-08 MED ORDER — FENTANYL CITRATE (PF) 100 MCG/2ML IJ SOLN
INTRAMUSCULAR | Status: DC | PRN
  Administered 2017-10-08: 100 ug via INTRAVENOUS

## 2017-10-08 MED ORDER — SODIUM CHLORIDE 0.9 % IV SOLN
INTRAVENOUS | Status: DC
  Administered 2017-10-08 – 2017-10-09 (×2): via INTRAVENOUS

## 2017-10-08 MED ORDER — STERILE WATER FOR IRRIGATION IR SOLN
Status: DC | PRN
  Administered 2017-10-08: 2000 mL

## 2017-10-08 MED ORDER — HYDROCODONE-ACETAMINOPHEN 5-325 MG PO TABS
1.0000 | ORAL_TABLET | ORAL | Status: DC | PRN
  Administered 2017-10-08 (×2): 1 via ORAL
  Administered 2017-10-09 – 2017-10-10 (×8): 2 via ORAL
  Filled 2017-10-08: qty 2
  Filled 2017-10-08: qty 1
  Filled 2017-10-08 (×4): qty 2
  Filled 2017-10-08: qty 1
  Filled 2017-10-08 (×3): qty 2

## 2017-10-08 MED ORDER — HYDROMORPHONE HCL 1 MG/ML IJ SOLN
0.2500 mg | INTRAMUSCULAR | Status: DC | PRN
  Administered 2017-10-08: 0.5 mg via INTRAVENOUS
  Administered 2017-10-08 (×2): 0.25 mg via INTRAVENOUS
  Administered 2017-10-08: 0.5 mg via INTRAVENOUS

## 2017-10-08 MED ORDER — METOCLOPRAMIDE HCL 5 MG PO TABS: 5.0000 mg | ORAL_TABLET | Freq: Three times a day (TID) | ORAL | Status: DC | PRN

## 2017-10-08 MED ORDER — MIDAZOLAM HCL 2 MG/2ML IJ SOLN: 0.5000 mg | Freq: Once | INTRAMUSCULAR | Status: DC | PRN

## 2017-10-08 MED ORDER — LACTATED RINGERS IV SOLN
INTRAVENOUS | Status: DC
  Administered 2017-10-08: 09:00:00 via INTRAVENOUS
  Administered 2017-10-08: 1000 mL via INTRAVENOUS
  Administered 2017-10-08: 11:00:00 via INTRAVENOUS

## 2017-10-08 MED ORDER — ONDANSETRON HCL 4 MG/2ML IJ SOLN
INTRAMUSCULAR | Status: DC | PRN
  Administered 2017-10-08: 4 mg via INTRAVENOUS

## 2017-10-08 MED ORDER — PROPOFOL 10 MG/ML IV BOLUS
INTRAVENOUS | Status: AC
  Filled 2017-10-08: qty 20

## 2017-10-08 MED ORDER — SODIUM CHLORIDE 0.9 % IR SOLN
Status: DC | PRN
  Administered 2017-10-08: 1000 mL

## 2017-10-08 MED ORDER — VITAMIN D3 25 MCG (1000 UNIT) PO TABS
2000.0000 [IU] | ORAL_TABLET | Freq: Every day | ORAL | Status: DC
Start: 2017-10-08 — End: 2017-10-10
  Administered 2017-10-08 – 2017-10-10 (×3): 2000 [IU] via ORAL
  Filled 2017-10-08 (×3): qty 2

## 2017-10-08 MED ORDER — DOCUSATE SODIUM 100 MG PO CAPS
100.0000 mg | ORAL_CAPSULE | Freq: Two times a day (BID) | ORAL | Status: DC
  Administered 2017-10-08 – 2017-10-10 (×4): 100 mg via ORAL
  Filled 2017-10-08 (×4): qty 1

## 2017-10-08 MED ORDER — MEPERIDINE HCL 50 MG/ML IJ SOLN: 6.2500 mg | INTRAMUSCULAR | Status: DC | PRN

## 2017-10-08 MED ORDER — HYDROMORPHONE HCL 1 MG/ML IJ SOLN
INTRAMUSCULAR | Status: AC
  Administered 2017-10-09: 1 mg via INTRAVENOUS
  Filled 2017-10-08: qty 2

## 2017-10-08 MED ORDER — OXYCODONE HCL 5 MG PO TABS
10.0000 mg | ORAL_TABLET | ORAL | Status: DC | PRN
  Administered 2017-10-08 – 2017-10-09 (×4): 10 mg via ORAL
  Filled 2017-10-08 (×4): qty 2

## 2017-10-08 MED ORDER — CHLORHEXIDINE GLUCONATE 4 % EX LIQD: 60.0000 mL | Freq: Once | CUTANEOUS | Status: DC

## 2017-10-08 MED ORDER — CLINDAMYCIN PHOSPHATE 600 MG/50ML IV SOLN
600.0000 mg | Freq: Four times a day (QID) | INTRAVENOUS | Status: AC
  Administered 2017-10-08 (×2): 600 mg via INTRAVENOUS
  Filled 2017-10-08 (×2): qty 50

## 2017-10-08 MED ORDER — POLYETHYLENE GLYCOL 3350 17 G PO PACK: 17.0000 g | PACK | Freq: Every day | ORAL | Status: DC | PRN

## 2017-10-08 MED ORDER — METHOCARBAMOL 500 MG PO TABS
500.0000 mg | ORAL_TABLET | Freq: Four times a day (QID) | ORAL | Status: DC | PRN
  Administered 2017-10-08 – 2017-10-10 (×5): 500 mg via ORAL
  Filled 2017-10-08 (×5): qty 1

## 2017-10-08 MED ORDER — PROPOFOL 10 MG/ML IV BOLUS
INTRAVENOUS | Status: DC | PRN
  Administered 2017-10-08: 40 mg via INTRAVENOUS

## 2017-10-08 MED ORDER — ALUM & MAG HYDROXIDE-SIMETH 200-200-20 MG/5ML PO SUSP: 30.0000 mL | ORAL | Status: DC | PRN

## 2017-10-08 MED ORDER — PHENYLEPHRINE 40 MCG/ML (10ML) SYRINGE FOR IV PUSH (FOR BLOOD PRESSURE SUPPORT)
PREFILLED_SYRINGE | INTRAVENOUS | Status: DC | PRN
  Administered 2017-10-08 (×4): 80 ug via INTRAVENOUS

## 2017-10-08 MED ORDER — PROPOFOL 10 MG/ML IV BOLUS
INTRAVENOUS | Status: AC
  Filled 2017-10-08: qty 60

## 2017-10-08 MED ORDER — MENTHOL 3 MG MT LOZG
1.0000 | LOZENGE | OROMUCOSAL | Status: DC | PRN
Start: 2017-10-08 — End: 2017-10-10
  Filled 2017-10-08: qty 9

## 2017-10-08 MED ORDER — DIPHENHYDRAMINE HCL 12.5 MG/5ML PO ELIX: 12.5000 mg | ORAL_SOLUTION | ORAL | Status: DC | PRN

## 2017-10-08 MED ORDER — DEXTROSE 5 % IV SOLN
500.0000 mg | Freq: Four times a day (QID) | INTRAVENOUS | Status: DC | PRN
  Administered 2017-10-08: 500 mg via INTRAVENOUS
  Filled 2017-10-08: qty 550

## 2017-10-08 MED ORDER — HYDROMORPHONE HCL 1 MG/ML IJ SOLN
1.0000 mg | INTRAMUSCULAR | Status: DC | PRN
  Administered 2017-10-08 – 2017-10-09 (×6): 1 mg via INTRAVENOUS
  Filled 2017-10-08 (×6): qty 1

## 2017-10-08 MED ORDER — PHENYLEPHRINE HCL 10 MG/ML IJ SOLN
INTRAMUSCULAR | Status: AC
  Filled 2017-10-08: qty 1

## 2017-10-08 MED ORDER — METOCLOPRAMIDE HCL 5 MG/ML IJ SOLN: 5.0000 mg | Freq: Three times a day (TID) | INTRAMUSCULAR | Status: DC | PRN

## 2017-10-08 MED ORDER — CLINDAMYCIN PHOSPHATE 900 MG/50ML IV SOLN
900.0000 mg | INTRAVENOUS | Status: AC
  Administered 2017-10-08: 900 mg via INTRAVENOUS

## 2017-10-08 MED ORDER — MIDAZOLAM HCL 2 MG/2ML IJ SOLN
INTRAMUSCULAR | Status: AC
  Filled 2017-10-08: qty 2

## 2017-10-08 MED ORDER — PHENYLEPHRINE 40 MCG/ML (10ML) SYRINGE FOR IV PUSH (FOR BLOOD PRESSURE SUPPORT)
PREFILLED_SYRINGE | INTRAVENOUS | Status: AC
  Filled 2017-10-08: qty 10

## 2017-10-08 MED ORDER — ACETAMINOPHEN 325 MG PO TABS: 650.0000 mg | ORAL_TABLET | ORAL | Status: DC | PRN

## 2017-10-08 MED ORDER — ONDANSETRON HCL 4 MG/2ML IJ SOLN: 4.0000 mg | Freq: Four times a day (QID) | INTRAMUSCULAR | Status: DC | PRN

## 2017-10-08 MED ORDER — PHENYLEPHRINE HCL 10 MG/ML IJ SOLN
INTRAVENOUS | Status: DC | PRN
  Administered 2017-10-08: 50 ug/min via INTRAVENOUS

## 2017-10-08 MED ORDER — ASPIRIN EC 325 MG PO TBEC
325.0000 mg | DELAYED_RELEASE_TABLET | Freq: Every day | ORAL | Status: DC
Start: 2017-10-09 — End: 2017-10-10
  Administered 2017-10-09 – 2017-10-10 (×2): 325 mg via ORAL
  Filled 2017-10-08 (×2): qty 1

## 2017-10-08 MED ORDER — PHENOL 1.4 % MT LIQD: 1.0000 | OROMUCOSAL | Status: DC | PRN

## 2017-10-08 MED ORDER — ACETAMINOPHEN 650 MG RE SUPP: 650.0000 mg | RECTAL | Status: DC | PRN

## 2017-10-08 MED ORDER — PROPOFOL 500 MG/50ML IV EMUL
INTRAVENOUS | Status: DC | PRN
  Administered 2017-10-08: 100 ug/kg/min via INTRAVENOUS

## 2017-10-08 MED ORDER — ZOLPIDEM TARTRATE 5 MG PO TABS: 5.0000 mg | ORAL_TABLET | Freq: Every evening | ORAL | Status: DC | PRN

## 2017-10-08 MED ORDER — MOMETASONE FURO-FORMOTEROL FUM 200-5 MCG/ACT IN AERO
2.0000 | INHALATION_SPRAY | Freq: Two times a day (BID) | RESPIRATORY_TRACT | Status: DC
  Filled 2017-10-08: qty 8.8

## 2017-10-08 SURGICAL SUPPLY — 37 items
BAG ZIPLOCK 12X15 (MISCELLANEOUS) IMPLANT
BENZOIN TINCTURE PRP APPL 2/3 (GAUZE/BANDAGES/DRESSINGS) ×2 IMPLANT
BLADE SAW SGTL 18X1.27X75 (BLADE) ×2 IMPLANT
CAPT HIP TOTAL 2 ×2 IMPLANT
CELLS DAT CNTRL 66122 CELL SVR (MISCELLANEOUS) IMPLANT
COVER PERINEAL POST (MISCELLANEOUS) ×2 IMPLANT
COVER SURGICAL LIGHT HANDLE (MISCELLANEOUS) ×2 IMPLANT
DRAPE STERI IOBAN 125X83 (DRAPES) ×2 IMPLANT
DRAPE U-SHAPE 47X51 STRL (DRAPES) ×4 IMPLANT
DRSG AQUACEL AG ADV 3.5X10 (GAUZE/BANDAGES/DRESSINGS) ×2 IMPLANT
DURAPREP 26ML APPLICATOR (WOUND CARE) ×2 IMPLANT
ELECT REM PT RETURN 15FT ADLT (MISCELLANEOUS) ×2 IMPLANT
GAUZE XEROFORM 1X8 LF (GAUZE/BANDAGES/DRESSINGS) IMPLANT
GLOVE BIO SURGEON STRL SZ7.5 (GLOVE) ×2 IMPLANT
GLOVE BIOGEL PI IND STRL 7.5 (GLOVE) ×4 IMPLANT
GLOVE BIOGEL PI IND STRL 8 (GLOVE) ×2 IMPLANT
GLOVE BIOGEL PI INDICATOR 7.5 (GLOVE) ×4
GLOVE BIOGEL PI INDICATOR 8 (GLOVE) ×2
GLOVE ECLIPSE 8.0 STRL XLNG CF (GLOVE) ×2 IMPLANT
GOWN STRL REUS W/ TWL LRG LVL3 (GOWN DISPOSABLE) ×2 IMPLANT
GOWN STRL REUS W/TWL LRG LVL3 (GOWN DISPOSABLE) ×2
GOWN STRL REUS W/TWL XL LVL3 (GOWN DISPOSABLE) ×4 IMPLANT
HANDPIECE INTERPULSE COAX TIP (DISPOSABLE) ×1
HOLDER FOLEY CATH W/STRAP (MISCELLANEOUS) ×2 IMPLANT
PACK ANTERIOR HIP CUSTOM (KITS) ×2 IMPLANT
RTRCTR WOUND ALEXIS 18CM MED (MISCELLANEOUS)
SET HNDPC FAN SPRY TIP SCT (DISPOSABLE) ×1 IMPLANT
STAPLER VISISTAT 35W (STAPLE) IMPLANT
STRIP CLOSURE SKIN 1/2X4 (GAUZE/BANDAGES/DRESSINGS) ×2 IMPLANT
SUT ETHIBOND NAB CT1 #1 30IN (SUTURE) ×2 IMPLANT
SUT MNCRL AB 4-0 PS2 18 (SUTURE) ×2 IMPLANT
SUT VIC AB 0 CT1 36 (SUTURE) ×2 IMPLANT
SUT VIC AB 1 CT1 36 (SUTURE) ×2 IMPLANT
SUT VIC AB 2-0 CT1 27 (SUTURE) ×2
SUT VIC AB 2-0 CT1 TAPERPNT 27 (SUTURE) ×2 IMPLANT
TRAY FOLEY CATH 14FR (SET/KITS/TRAYS/PACK) ×2 IMPLANT
YANKAUER SUCT BULB TIP 10FT TU (MISCELLANEOUS) ×2 IMPLANT

## 2017-10-08 NOTE — Transfer of Care (Signed)
Immediate Anesthesia Transfer of Care Note  Patient: Yolanda FrayMartha R Renderos  Procedure(s) Performed: LEFT TOTAL HIP ARTHROPLASTY ANTERIOR APPROACH (Left Hip)  Patient Location: PACU  Anesthesia Type:Spinal  Level of Consciousness: awake, alert  and oriented  Airway & Oxygen Therapy: Patient Spontanous Breathing and Patient connected to nasal cannula oxygen  Post-op Assessment: Report given to RN and Post -op Vital signs reviewed and stable  Post vital signs: Reviewed and stable  Last Vitals:  Vitals:   10/08/17 0722  BP: 104/65  Pulse: 76  Resp: 16  Temp: 36.9 C  SpO2: 97%    Last Pain:  Vitals:   10/08/17 0743  TempSrc:   PainSc: 4       Patients Stated Pain Goal: 4 (10/08/17 0743)  Complications: No apparent anesthesia complications

## 2017-10-08 NOTE — H&P (Signed)
TOTAL HIP ADMISSION H&P  Patient is admitted for left total hip arthroplasty.  Subjective:  Chief Complaint: left hip pain  HPI: Yolanda Ochoa, 56 y.o. female, has a history of pain and functional disability in the left hip(s) due to arthritis and patient has failed non-surgical conservative treatments for greater than 12 weeks to include NSAID's and/or analgesics, corticosteriod injections, flexibility and strengthening excercises, use of assistive devices, weight reduction as appropriate and activity modification.  Onset of symptoms was gradual starting 3 years ago with gradually worsening course since that time.The patient noted no past surgery on the left hip(s).  Patient currently rates pain in the left hip at 10 out of 10 with activity. Patient has night pain, worsening of pain with activity and weight bearing, pain that interfers with activities of daily living and pain with passive range of motion. Patient has evidence of subchondral sclerosis, periarticular osteophytes and joint space narrowing by imaging studies. This condition presents safety issues increasing the risk of falls.  There is no current active infection.  Patient Active Problem List   Diagnosis Date Noted  . Status post total replacement of left hip 10/08/2017  . Unilateral primary osteoarthritis, right hip 08/13/2017  . Status post total replacement of right hip 08/13/2017  . Unilateral primary osteoarthritis, left hip 02/22/2017  . Pain of left hip joint 02/22/2017   Past Medical History:  Diagnosis Date  . Anemia    hx of  . Anxiety   . Arthritis   . Asthma   . GERD (gastroesophageal reflux disease)    hx of no meds  . Pneumonia    walking pneumonia    Past Surgical History:  Procedure Laterality Date  . fatty tumor removed     back of neck and R shoulder  . FOOT SURGERY     bil bunion and hammer toe repair  . NASAL SINUS SURGERY    . TONSILLECTOMY    . TOTAL HIP ARTHROPLASTY Right 08/13/2017    Procedure: RIGHT TOTAL HIP ARTHROPLASTY ANTERIOR APPROACH;  Surgeon: Kathryne HitchBlackman, Jaceon Heiberger Y, MD;  Location: [facility];  Service: Orthopedics;  Laterality: Right;    Current Facility-Administered Medications  Medication Dose Route Frequency Provider Last Rate Last Dose  . chlorhexidine (HIBICLENS) 4 % liquid 4 application  60 mL Topical Once Richardean Canallark, Gilbert W, PA-C      . clindamycin (CLEOCIN) 900 MG/50ML IVPB           . clindamycin (CLEOCIN) IVPB 900 mg  900 mg Intravenous On Call to OR Kirtland Bouchardlark, Gilbert W, PA-C      . lactated ringers infusion   Intravenous Continuous Jairo BenJackson, [place]well, MD      . tranexamic acid (CYKLOKAPRON) 1,000 mg in sodium chloride 0.9 % 100 mL IVPB  1,000 mg Intravenous To OR Kirtland Bouchardlark, Gilbert W, PA-C       Allergies  Allergen Reactions  . Codeine Rash  . Penicillins Rash and Other (See Comments)    Has patient had a PCN reaction causing immediate rash, facial/tongue/throat swelling, SOB or lightheadedness with hypotension: No Has patient had a PCN reaction causing severe rash involving mucus membranes or skin necrosis: No Has patient had a PCN reaction that required hospitalization: No Has patient had a PCN reaction occurring within the last 10 years: No If all of the above answers are "NO", then may proceed with Cephalosporin use.     Social History   Tobacco Use  . Smoking status: Never Smoker  . Smokeless tobacco: Never  Used  Substance Use Topics  . Alcohol use: Yes    Alcohol/week: 0.6 oz    Types: 1 Glasses of wine per week    Comment: socially    History reviewed. No pertinent family history.   Review of Systems  Musculoskeletal: Positive for joint pain.  All other systems reviewed and are negative.   Objective:  Physical Exam  Constitutional: She is oriented to person, place, and time. She appears well-developed and well-nourished.  HENT:  Head: Normocephalic and atraumatic.  Eyes: EOM are normal. Pupils are equal, round, and reactive to light.   Neck: Normal range of motion. Neck supple.  Cardiovascular: Normal rate and regular rhythm.  Respiratory: Effort normal and breath sounds normal.  GI: Soft. Bowel sounds are normal.  Musculoskeletal:       Left hip: She exhibits decreased range of motion, decreased strength, tenderness and bony tenderness.  Neurological: She is alert and oriented to person, place, and time.  Skin: Skin is warm and dry.  Psychiatric: She has a normal mood and affect.    Vital signs in last 24 hours: Temp:  [98.4 F (36.9 C)] 98.4 F (36.9 C) (12/14 0722) Pulse Rate:  [76] 76 (12/14 0722) Resp:  [16] 16 (12/14 0722) BP: (104)/(65) 104/65 (12/14 0722) SpO2:  [97 %] 97 % (12/14 0722)  Labs:   Estimated body mass index is 29.18 kg/m as calculated from the following:   Height as of 10/05/17: 5\' 4"  (1.626 m).   Weight as of 10/05/17: 170 lb (77.1 kg).   Imaging Review Plain radiographs demonstrate severe degenerative joint disease of the left hip(s). The bone quality appears to be excellent for age and reported activity level.  Assessment/Plan:  End stage arthritis, left hip(s)  The patient history, physical examination, clinical judgement of the provider and imaging studies are consistent with end stage degenerative joint disease of the left hip(s) and total hip arthroplasty is deemed medically necessary. The treatment options including medical management, injection therapy, arthroscopy and arthroplasty were discussed at length. The risks and benefits of total hip arthroplasty were presented and reviewed. The risks due to aseptic loosening, infection, stiffness, dislocation/subluxation,  thromboembolic complications and other imponderables were discussed.  The patient acknowledged the explanation, agreed to proceed with the plan and consent was signed. Patient is being admitted for inpatient treatment for surgery, pain control, PT, OT, prophylactic antibiotics, VTE prophylaxis, progressive ambulation  and ADL's and discharge planning.The patient is planning to be discharged home with home health services

## 2017-10-08 NOTE — Anesthesia Postprocedure Evaluation (Signed)
Anesthesia Post Note  Patient: Yolanda Ochoa  Procedure(s) Performed: LEFT TOTAL HIP ARTHROPLASTY ANTERIOR APPROACH (Left Hip)     Patient location during evaluation: PACU Anesthesia Type: Spinal Level of consciousness: awake and alert, combative, patient cooperative and responds to stimulation Pain management: pain level controlled Vital Signs Assessment: post-procedure vital signs reviewed and stable Respiratory status: spontaneous breathing, nonlabored ventilation, respiratory function stable and patient connected to nasal cannula oxygen Cardiovascular status: blood pressure returned to baseline and stable Postop Assessment: spinal receding, no apparent nausea or vomiting, adequate PO intake and patient able to bend at knees Anesthetic complications: no    Last Vitals:  Vitals:   10/08/17 1245 10/08/17 1300  BP: 99/63 (!) 102/53  Pulse: 69 62  Resp: 11   Temp: (!) 36.3 C 36.8 C  SpO2: 99% 100%    Last Pain:  Vitals:   10/08/17 1245  TempSrc:   PainSc: 2                  Ayn Domangue,E. Charlye Spare

## 2017-10-08 NOTE — Anesthesia Procedure Notes (Signed)
Spinal  Patient location during procedure: OR End time: 10/08/2017 9:39 AM Staffing Anesthesiologist: Jairo BenJackson, Sada Mazzoni, MD Performed: anesthesiologist  Preanesthetic Checklist Completed: patient identified, site marked, surgical consent, pre-op evaluation, timeout performed, IV checked, risks and benefits discussed and monitors and equipment checked Spinal Block Patient position: sitting Prep: site prepped and draped and DuraPrep Patient monitoring: blood pressure, continuous pulse ox, heart rate and cardiac monitor Approach: midline Location: L3-4 Injection technique: single-shot Needle Needle type: Pencan  Needle gauge: 24 G Needle length: 9 cm Additional Notes Pt identified in Operating room.  Monitors applied. Working IV access confirmed. Sterile prep, drape lumbar spine.  1% lido local L 3,4.  #24ga Pencan into clear CSF L 3,4.  13.5 mg 0.75% Bupivacaine with dextrose injected with asp CSF beginning and end of injection.  Patient asymptomatic, VSS, no heme aspirated, tolerated well.  Sandford Craze Tyna Huertas, MD

## 2017-10-08 NOTE — Anesthesia Preprocedure Evaluation (Addendum)
Anesthesia Evaluation  Patient identified by MRN, date of birth, ID band Patient awake    Reviewed: Allergy & Precautions, NPO status , Patient's Chart, lab work & pertinent test results  History of Anesthesia Complications Negative for: history of anesthetic complications  Airway Mallampati: I  TM Distance: >3 FB Neck ROM: Full    Dental  (+) Dental Advisory Given   Pulmonary asthma (last inhaler needed a week ago) ,    breath sounds clear to auscultation       Cardiovascular negative cardio ROS   Rhythm:Regular Rate:Normal     Neuro/Psych Anxiety negative neurological ROS     GI/Hepatic Neg liver ROS, GERD  Controlled,  Endo/Other  negative endocrine ROS  Renal/GU negative Renal ROS     Musculoskeletal  (+) Arthritis , Osteoarthritis,    Abdominal   Peds  Hematology negative hematology ROS (+) plt 331k   Anesthesia Other Findings   Reproductive/Obstetrics Post-menopausal                            Anesthesia Physical Anesthesia Plan  ASA: II  Anesthesia Plan: Spinal   Post-op Pain Management:    Induction:   PONV Risk Score and Plan: 2 and Ondansetron and Dexamethasone  Airway Management Planned: Natural Airway and Nasal Cannula  Additional Equipment:   Intra-op Plan:   Post-operative Plan:   Informed Consent: I have reviewed the patients History and Physical, chart, labs and discussed the procedure including the risks, benefits and alternatives for the proposed anesthesia with the patient or authorized representative who has indicated his/her understanding and acceptance.   Dental advisory given  Plan Discussed with: CRNA and Surgeon  Anesthesia Plan Comments: (Plan routine monitors, SAB)        Anesthesia Quick Evaluation

## 2017-10-08 NOTE — Brief Op Note (Signed)
10/08/2017  10:47 AM  PATIENT:  Yolanda Ochoa  56 y.o. female  PRE-OPERATIVE DIAGNOSIS:  osteoarthritis left hip  POST-OPERATIVE DIAGNOSIS:  osteoarthritis left hip  PROCEDURE:  Procedure(s): LEFT TOTAL HIP ARTHROPLASTY ANTERIOR APPROACH (Left)  SURGEON:  Surgeon(s) and Role:    Kathryne Hitch* Javonni Macke Y, MD - Primary  PHYSICIAN ASSISTANT: Rexene EdisonGil Clark, PA-C  ANESTHESIA:   spinal  EBL:  150 mL   COUNTS:  YES  DICTATION: .Other Dictation: Dictation Number [id number]  PLAN OF CARE: Admit to inpatient   PATIENT DISPOSITION:  PACU - hemodynamically stable.   Delay start of Pharmacological VTE agent (>24hrs) due to surgical blood loss or risk of bleeding: no

## 2017-10-09 ENCOUNTER — Other Ambulatory Visit: Payer: Self-pay

## 2017-10-09 LAB — CBC
HEMATOCRIT: 34.5 % — AB (ref 36.0–46.0)
Hemoglobin: 11 g/dL — ABNORMAL LOW (ref 12.0–15.0)
MCH: 30.6 pg (ref 26.0–34.0)
MCHC: 31.9 g/dL (ref 30.0–36.0)
MCV: 95.8 fL (ref 78.0–100.0)
PLATELETS: 303 10*3/uL (ref 150–400)
RBC: 3.6 MIL/uL — ABNORMAL LOW (ref 3.87–5.11)
RDW: 12.2 % (ref 11.5–15.5)
WBC: 11.8 10*3/uL — AB (ref 4.0–10.5)

## 2017-10-09 LAB — BASIC METABOLIC PANEL
ANION GAP: 10 (ref 5–15)
BUN: 10 mg/dL (ref 6–20)
CALCIUM: 8.9 mg/dL (ref 8.9–10.3)
CO2: 24 mmol/L (ref 22–32)
CREATININE: 0.64 mg/dL (ref 0.44–1.00)
Chloride: 105 mmol/L (ref 101–111)
Glucose, Bld: 126 mg/dL — ABNORMAL HIGH (ref 65–99)
Potassium: 3.7 mmol/L (ref 3.5–5.1)
SODIUM: 139 mmol/L (ref 135–145)

## 2017-10-09 MED ORDER — TIZANIDINE HCL 4 MG PO TABS: 4.0000 mg | ORAL_TABLET | Freq: Three times a day (TID) | ORAL | 0 refills | Status: DC | PRN

## 2017-10-09 MED ORDER — INFLUENZA VAC SPLIT QUAD 0.5 ML IM SUSY: 0.5000 mL | PREFILLED_SYRINGE | INTRAMUSCULAR | Status: DC

## 2017-10-09 MED ORDER — ASPIRIN 325 MG PO TBEC: 325.0000 mg | DELAYED_RELEASE_TABLET | Freq: Every day | ORAL | 0 refills | Status: AC

## 2017-10-09 MED ORDER — PNEUMOCOCCAL VAC POLYVALENT 25 MCG/0.5ML IJ INJ
0.5000 mL | INJECTION | INTRAMUSCULAR | Status: DC
  Filled 2017-10-09: qty 0.5

## 2017-10-09 MED ORDER — OXYCODONE-ACETAMINOPHEN 5-325 MG PO TABS: 1.0000 | ORAL_TABLET | ORAL | 0 refills | Status: DC | PRN

## 2017-10-09 NOTE — Discharge Instructions (Signed)

## 2017-10-09 NOTE — Progress Notes (Signed)
Subjective: 1 Day Post-Op Procedure(s) (LRB): LEFT TOTAL HIP ARTHROPLASTY ANTERIOR APPROACH (Left) Patient reports pain as moderate.    Objective: Vital signs in last 24 hours: Temp:  [97.8 F (36.6 C)-98.4 F (36.9 C)] 98.1 F (36.7 C) (12/15 0921) Pulse Rate:  [62-80] 67 (12/15 0921) Resp:  [14-16] 16 (12/15 0921) BP: (87-103)/(51-64) 87/59 (12/15 0921) SpO2:  [95 %-100 %] 99 % (12/15 0921)  Intake/Output from previous day: 12/14 0701 - 12/15 0700 In: 3705.1 [P.O.:480; I.V.:3074.5; IV Piggyback:150.6] Out: 3330 [Urine:3180; Blood:150] Intake/Output this shift: Total I/O In: 240 [P.O.:240] Out: 550 [Urine:550]  Recent Labs    10/09/17 0512  HGB 11.0*   Recent Labs    10/09/17 0512  WBC 11.8*  RBC 3.60*  HCT 34.5*  PLT 303   Recent Labs    10/09/17 0512  NA 139  K 3.7  CL 105  CO2 24  BUN 10  CREATININE 0.64  GLUCOSE 126*  CALCIUM 8.9   No results for input(s): LABPT, INR in the last 72 hours.  Sensation intact distally Intact pulses distally Dorsiflexion/Plantar flexion intact Incision: dressing C/D/I  Assessment/Plan: 1 Day Post-Op Procedure(s) (LRB): LEFT TOTAL HIP ARTHROPLASTY ANTERIOR APPROACH (Left) Up with therapy Plan for discharge tomorrow Discharge home with home health  Kathryne HitchChristopher Y Kaladin Noseworthy 10/09/2017, 12:54 PM

## 2017-10-09 NOTE — Evaluation (Addendum)
Physical Therapy Evaluation Patient Details Name: Yolanda FrayMartha R Ochoa MRN: [id number] DOB: Jun 22, 1961 Today's Date: 10/09/2017   History of Present Illness  s/p L THA; PMHx: R THA  Clinical Impression  Pt is s/p TKA resulting in the deficits listed below (see PT Problem List).  Pt will benefit from skilled PT to increase their independence and safety with mobility to allow discharge to the venue listed below.  Pt should continue to progress well; has DME, her sister will be with her at home to provide assist as needed;  Pt with low BP last night and this am, 87/59, incr to 107/67 sitting EOB    Follow Up Recommendations DC plan and follow up therapy as arranged by surgeon    Equipment Recommendations  None recommended by PT    Recommendations for Other Services       Precautions / Restrictions Precautions Precautions: Fall Restrictions Weight Bearing Restrictions: No      Mobility  Bed Mobility Overal bed mobility: Needs Assistance Bed Mobility: Supine to Sit     Supine to sit: Min assist     General bed mobility comments: assist with LLE, incr time  Transfers   Equipment used: Rolling walker (2 wheeled)             General transfer comment: cues for hand placement  Ambulation/Gait Ambulation/Gait assistance: Min guard Ambulation Distance (Feet): 30 Feet Assistive device: Rolling walker (2 wheeled) Gait Pattern/deviations: Step-to pattern;Decreased step length - left;Decreased step length - right   Gait velocity interpretation: Below normal speed for age/gender General Gait Details: cues for sequence and posture  Stairs            Wheelchair Mobility    Modified Rankin (Stroke Patients Only)       Balance                                             Pertinent Vitals/Pain Pain Assessment: 0-10 Pain Score: 7  Pain Location: left hip Pain Descriptors / Indicators: Sore Pain Intervention(s): Limited activity within patient's  tolerance;Monitored during session;Premedicated before session    Home Living Family/patient expects to be discharged to:: Private residence Living Arrangements: Alone Available Help at Discharge: Family;Available 24 hours/day Type of Home: House Home Access: Stairs to enter   Entergy CorporationEntrance Stairs-Number of Steps: 2 Home Layout: One level Home Equipment: Adaptive equipment;Bedside commode;Hand held shower head;Shower seat;Cane - single point Additional Comments: pt sister to assist at home as needed    Prior Function Level of Independence: Independent with assistive device(s);Independent               Hand Dominance        Extremity/Trunk Assessment   Upper Extremity Assessment Upper Extremity Assessment: Overall WFL for tasks assessed    Lower Extremity Assessment Lower Extremity Assessment: LLE deficits/detail LLE Deficits / Details: AAROM WFL, strength grossly 2+/5, limited by post  op pain and weakness as anticipated       Communication   Communication: No difficulties  Cognition Arousal/Alertness: Awake/alert Behavior During Therapy: WFL for tasks assessed/performed Overall Cognitive Status: Within Functional Limits for tasks assessed                                        General Comments  Exercises Total Joint Exercises Ankle Circles/Pumps: AROM;Both;10 reps Quad Sets: AROM;Both   Assessment/Plan    PT Assessment Patient needs continued PT services  PT Problem List Decreased strength;Decreased activity tolerance;Decreased mobility;Decreased knowledge of use of DME;Pain       PT Treatment Interventions DME instruction;Gait training;Functional mobility training;Stair training;Therapeutic exercise;Therapeutic activities;Patient/family education    PT Goals (Current goals can be found in the Care Plan section)  Acute Rehab PT Goals Patient Stated Goal: less pain, back to being IND PT Goal Formulation: With patient Potential to  Achieve Goals: Good    Frequency 7X/week   Barriers to discharge        Co-evaluation               AM-PAC PT "6 Clicks" Daily Activity  Outcome Measure Difficulty turning over in bed (including adjusting bedclothes, sheets and blankets)?: Unable Difficulty moving from lying on back to sitting on the side of the bed? : Unable Difficulty sitting down on and standing up from a chair with arms (e.g., wheelchair, bedside commode, etc,.)?: Unable Help needed moving to and from a bed to chair (including a wheelchair)?: A Little Help needed walking in hospital room?: A Little Help needed climbing 3-5 steps with a railing? : A Little 6 Click Score: 12    End of Session Equipment Utilized During Treatment: Gait belt Activity Tolerance: Patient tolerated treatment well Patient left: in chair;with call bell/phone within reach;with family/visitor present   PT Visit Diagnosis: Difficulty in walking, not elsewhere classified (R26.2)    Time: [id number] PT Time Calculation (min) (ACUTE ONLY): 31 min   Charges:   PT Evaluation $PT Eval Low Complexity: 1 Low PT Treatments $Gait Training: 8-22 mins   PT G CodesDrucilla Chalet:       Caralee Morea, PT Pager: [phone] 10/09/2017    [facility] AllianceWILLIAMS,Yolanda Wynder 10/09/2017, 12:35 PM

## 2017-10-09 NOTE — Progress Notes (Signed)
   10/09/17 1600  PT Visit Information  Last PT Received On 10/09/17--progressing toward goals; pt fatigued from therapy and being up to bathroom; exercise focused session; pt should be ready to D/C from PT standpoint tomorrow provided pain remains controlled  Assistance Needed +1  History of Present Illness s/p L THA; PMHx: R THA  Subjective Data  Patient Stated Goal less pain, back to being IND  Precautions  Precautions Fall  Restrictions  Weight Bearing Restrictions No  Pain Assessment  Pain Location left hip  Pain Descriptors / Indicators Sore  Pain Intervention(s) Limited activity within patient's tolerance;Monitored during session;Premedicated before session;Ice applied  Cognition  Arousal/Alertness Awake/alert  Behavior During Therapy WFL for tasks assessed/performed  Overall Cognitive Status Within Functional Limits for tasks assessed  Bed Mobility  General bed mobility comments (deferred d/t fatigue)  Total Joint Exercises  Ankle Circles/Pumps AROM;Both;10 reps  Quad Sets AROM;Both;10 reps  Gluteal Sets Strengthening;Both;10 reps  Short Arc Quad AROM;Left;15 reps  Heel Slides AAROM;Left;15 reps  Hip ABduction/ADduction AAROM;AROM;Left;15 reps  PT - End of Session  Activity Tolerance Patient tolerated treatment well  Patient left in bed;with call bell/phone within reach  PT - Assessment/Plan  PT Plan Current plan remains appropriate  PT Visit Diagnosis Difficulty in walking, not elsewhere classified (R26.2)  PT Frequency (ACUTE ONLY) 7X/week  Follow Up Recommendations DC plan and follow up therapy as arranged by surgeon  PT equipment None recommended by PT  AM-PAC PT "6 Clicks" Daily Activity Outcome Measure  Difficulty turning over in bed (including adjusting bedclothes, sheets and blankets)? 1  Difficulty moving from lying on back to sitting on the side of the bed?  1  Difficulty sitting down on and standing up from a chair with arms (e.g., wheelchair, bedside  commode, etc,.)? 1  Help needed moving to and from a bed to chair (including a wheelchair)? 3  Help needed walking in hospital room? 3  Help needed climbing 3-5 steps with a railing?  3  6 Click Score 12  Mobility G Code  CL  PT Goal Progression  Progress towards PT goals Progressing toward goals  Acute Rehab PT Goals  PT Goal Formulation With patient  Potential to Achieve Goals Good  PT Time Calculation  PT Start Time (ACUTE ONLY) 1555  PT Stop Time (ACUTE ONLY) 1617  PT Time Calculation (min) (ACUTE ONLY) 22 min  PT General Charges  $$ ACUTE PT VISIT 1 Visit  PT Treatments  $Therapeutic Exercise 8-22 mins

## 2017-10-09 NOTE — Progress Notes (Signed)
OT Cancellation Note  Patient Details Name: Yolanda Ochoa MRN: [id number] DOB: 03-Jun-1961   Cancelled Treatment:    Reason Eval/Treat Not Completed: PT screened, no needs identified, will sign off  Yolanda Ochoa 10/09/2017, 12:37 PM  Yolanda Ochoa, OTR/L [phone] 10/09/2017

## 2017-10-09 NOTE — Op Note (Signed)
NAMPietro Cassis:  Yolanda Ochoa, Yolanda Ochoa                ACCOUNT NO.:  [id number]  MEDICAL RECORD NO.:  [id number]  LOCATION:  [facility]                         [facility]  PHYSICIAN:  Yolanda Ochoa, M.D.DATE OF BIRTH:  [birth year]  DATE OF PROCEDURE:  10/08/2017 DATE OF DISCHARGE:                              OPERATIVE REPORT   PREOPERATIVE DIAGNOSIS:  Primary osteoarthritis and degenerative joint disease, left hip.  POSTOPERATIVE DIAGNOSIS:  Primary osteoarthritis and degenerative joint disease, left hip.  PROCEDURE:  Left total hip arthroplasty through direct anterior approach.  IMPLANTS:  DePuy Sector Gription acetabular component size 50, size 32 +1 polyethylene liner, size 10 Corail femoral component with standard offset, size 32 +1 ceramic hip ball.  SURGEON:  Yolanda PandaChristopher Y. Magnus IvanBlackman, MD.  ASSISTANT:  Richardean CanalGilbert Clark, PA-C.  ANESTHESIA:  Spinal.  ANTIBIOTICS:  900 mg IV clindamycin.  BLOOD LOSS:  150 mL.  COMPLICATIONS:  None.  INDICATIONS:  Yolanda Ochoa is a 56 year old female with debilitating arthritis of both her hips.  Under 2 months ago, she underwent a successful right total hip arthroplasty.  Her left hip has severe pain and has known well documented end-stage arthritis.  Due to the success of her right hip, she wanted to go ahead and proceed with her left hip, and we see no problems with that as she has done so well on the right side.  She understands the risk of acute blood loss anemia, nerve and vessel injury, fracture, infection, dislocation, and DVT.  She understands our goals are to decrease pain, improve mobility, and overall improve quality of life.  PROCEDURE DESCRIPTION:  After informed consent was obtained, appropriate left hip was marked.  She was brought to the operating room and spinal anesthesia was obtained while she was on her stretcher.  She was then laid in a supine position on the stretcher.  A Foley catheter was placed and both feet had traction  boots applied to them.  Next, she was placed supine on the Hana fracture table with the perineal post in place and both legs in inline skeletal traction devices, but no traction applied. The left operative hip was prepped and draped with DuraPrep and sterile drapes.  A time-out was called and she was identified as correct patient and correct left hip.  I then made an incision just inferior and posterior to the anterior superior iliac spine and carried this obliquely down the leg.  We dissected down the tensor fascia lata muscle.  The tensor fascia was then divided longitudinally to proceed with a direct anterior approach to the hip.  We identified and cauterized circumflex vessels and identified the hip capsule.  I opened up the hip capsule in an L-type format, finding a large joint effusion, and significant arthritis around her left hip.  I placed Cobra retractors around the lateral and medial femoral neck and then made our femoral neck cut with an oscillating saw and completed this with an osteotome.  We placed a corkscrew guide in the femoral head and removed the femoral head in its entirety and found a large area of devoid of cartilage.  I then placed a bent Hohmann over the medial acetabular rim and removed  periarticular osteophytes around the acetabulum as well as acetabular labrum and other debris.  I then began reaming under direct visualization from a size 42 reamer, going up to a size 50, with all reamers under direct visualization and the last reamer under direct fluoroscopy, so we could obtain our depth of reaming, our inclination and anteversion.  Once we were pleased with this, we placed the real DePuy Sector Gription acetabular component size 50 and a 32 +1 polyethylene liner for that size of the acetabular component.  Attention was then turned to the femur.  With the leg externally rotated to 120 degrees, extended and adducted, we were able to place a Mueller retractor  medially and a Hohmann retractor behind the greater trochanter.  We released the lateral joint capsule and used a box cutting osteotome to enter the femoral canal and a rongeur to lateralize.  We then began broaching from a size 8 broach using the Corail broaching system going up to a size 10.  With a size 10 in place, we trialed a standard offset femoral neck and a 32 +1 hip ball.  We reduced this into the acetabulum.  We were pleased with leg length, offset, stability, and range of motion.  We then dislocated the hip and removed the trial components.  We were able to place the real Corail femoral component size 10 with standard offset and the real 32 +1 hip ball.  Again, we reduced this in the acetabulum.  We were pleased with stability.  We then irrigated the soft tissue with normal saline solution using pulsatile lavage.  We closed the joint capsule with interrupted #1 Ethibond followed by running #1 Vicryl in the tensor fascia, 0 Vicryl in the deep tissue, 2-0 Vicryl in the subcutaneous tissue, and 4-0 Monocryl subcuticular stitch and Steri-Strips on the skin.  An Aquacel dressing was applied.  She was taken off the Hana table and taken to the recovery room in stable condition.  All final counts were correct.  There were no complications noted.     Yolanda Pandahristopher Y. Magnus Ochoa, M.D.     CYB/MEDQ  D:  10/08/2017  T:  10/09/2017  Job:  [id number]

## 2017-10-10 NOTE — Progress Notes (Signed)
Physical Therapy Treatment Patient Details Name: Yolanda FrayMartha R Ochoa MRN: [id number] DOB: 1961/06/28 Today's Date: 10/10/2017    History of Present Illness s/p L THA; PMHx: R THA    PT Comments    Pt continues to progress toward goals; pt had brief episode of dizziness after going up /down 3 stairs, chair to pt; BP 111/64 (BP has been running low since surgery); pt  Symptoms improved  After reclined in chair; pt sister present for session; RN and NT made aware; pt states she will have sister there 24/7 at home and still feels comfortable with d/c today; advised pt to amb at least a short distance  Once more with nursing staff/amb to bathroom and inform RN if she has any further symptoms; if not she shoul d be ok to d/  Follow Up Recommendations  DC plan and follow up therapy as arranged by surgeon     Equipment Recommendations  None recommended by PT    Recommendations for Other Services       Precautions / Restrictions Precautions Precautions: Fall Restrictions Weight Bearing Restrictions: No    Mobility  Bed Mobility Overal bed mobility: Needs Assistance             General bed mobility comments: pt exiting bathroom upon arrival  Transfers Overall transfer level: Needs assistance Equipment used: Rolling walker (2 wheeled) Transfers: Sit to/from Stand Sit to Stand: Supervision         General transfer comment: cues for hand placement  Ambulation/Gait Ambulation/Gait assistance: Supervision;Min guard Ambulation Distance (Feet): 40 Feet Assistive device: Rolling walker (2 wheeled) Gait Pattern/deviations: Step-to pattern;Decreased step length - left;Decreased step length - right     General Gait Details: cues for sequence and posture   Stairs Stairs: Yes   Stair Management: Two rails;Step to pattern;Forwards Number of Stairs: 3 General stair comments: cues for sequence  Wheelchair Mobility    Modified Rankin (Stroke Patients Only)       Balance                                             Cognition Arousal/Alertness: Awake/alert Behavior During Therapy: WFL for tasks assessed/performed Overall Cognitive Status: Within Functional Limits for tasks assessed                                        Exercises      General Comments        Pertinent Vitals/Pain Pain Score: 3  Pain Location: left hip Pain Descriptors / Indicators: Sore Pain Intervention(s): Limited activity within patient's tolerance;Monitored during session;Premedicated before session;Ice applied    Home Living                      Prior Function            PT Goals (current goals can now be found in the care plan section) Acute Rehab PT Goals Patient Stated Goal: less pain, back to being IND PT Goal Formulation: With patient Potential to Achieve Goals: Good Progress towards PT goals: Progressing toward goals    Frequency    7X/week      PT Plan Current plan remains appropriate    Co-evaluation  AM-PAC PT "6 Clicks" Daily Activity  Outcome Measure  Difficulty turning over in bed (including adjusting bedclothes, sheets and blankets)?: Unable Difficulty moving from lying on back to sitting on the side of the bed? : Unable Difficulty sitting down on and standing up from a chair with arms (e.g., wheelchair, bedside commode, etc,.)?: Unable Help needed moving to and from a bed to chair (including a wheelchair)?: A Little Help needed walking in hospital room?: A Little Help needed climbing 3-5 steps with a railing? : A Little 6 Click Score: 12    End of Session Equipment Utilized During Treatment: Gait belt Activity Tolerance: Patient tolerated treatment well Patient left: in chair;with call bell/phone within reach;with family/visitor present   PT Visit Diagnosis: Difficulty in walking, not elsewhere classified (R26.2)     Time: [id number] PT Time Calculation (min) (ACUTE ONLY):  34 min  Charges:  $Gait Training: 23-37 mins                    G Codes:          Nael Petrosyan 10/10/2017, 1:53 PM

## 2017-10-10 NOTE — Progress Notes (Signed)
   Subjective: 2 Days Post-Op Procedure(s) (LRB): LEFT TOTAL HIP ARTHROPLASTY ANTERIOR APPROACH (Left) Patient reports pain as mild.    Objective: Vital signs in last 24 hours: Temp:  [98 F (36.7 C)-98.6 F (37 C)] 98.6 F (37 C) (12/16 0622) Pulse Rate:  [67-77] 77 (12/16 0622) Resp:  [16] 16 (12/16 0622) BP: (87-107)/(59-72) 106/72 (12/16 0622) SpO2:  [96 %-99 %] 99 % (12/16 0622)  Intake/Output from previous day: 12/15 0701 - 12/16 0700 In: 480 [P.O.:480] Out: 1650 [Urine:1650] Intake/Output this shift: No intake/output data recorded.  Recent Labs    10/09/17 0512  HGB 11.0*   Recent Labs    10/09/17 0512  WBC 11.8*  RBC 3.60*  HCT 34.5*  PLT 303   Recent Labs    10/09/17 0512  NA 139  K 3.7  CL 105  CO2 24  BUN 10  CREATININE 0.64  GLUCOSE 126*  CALCIUM 8.9   No results for input(s): LABPT, INR in the last 72 hours.  Neurologically intact dressing one tiny spot drainage.  No results found.  Assessment/Plan: 2 Days Post-Op Procedure(s) (LRB): LEFT TOTAL HIP ARTHROPLASTY ANTERIOR APPROACH (Left) Up with therapy, discharge home today.   Eldred MangesMark C Avril Busser 10/10/2017, 8:17 AM

## 2017-10-12 ENCOUNTER — Telehealth (INDEPENDENT_AMBULATORY_CARE_PROVIDER_SITE_OTHER): Payer: Self-pay | Admitting: Radiology

## 2017-10-12 ENCOUNTER — Other Ambulatory Visit (INDEPENDENT_AMBULATORY_CARE_PROVIDER_SITE_OTHER): Payer: Self-pay | Admitting: Orthopaedic Surgery

## 2017-10-12 NOTE — Telephone Encounter (Signed)
[facility]

## 2017-10-12 NOTE — Telephone Encounter (Signed)
Please advise 

## 2017-10-13 DIAGNOSIS — D649 Anemia, unspecified: Secondary | ICD-10-CM | POA: Diagnosis not present

## 2017-10-13 DIAGNOSIS — Z9181 History of falling: Secondary | ICD-10-CM | POA: Diagnosis not present

## 2017-10-13 DIAGNOSIS — J45909 Unspecified asthma, uncomplicated: Secondary | ICD-10-CM | POA: Diagnosis not present

## 2017-10-13 DIAGNOSIS — Z7951 Long term (current) use of inhaled steroids: Secondary | ICD-10-CM | POA: Diagnosis not present

## 2017-10-13 DIAGNOSIS — F419 Anxiety disorder, unspecified: Secondary | ICD-10-CM | POA: Diagnosis not present

## 2017-10-13 DIAGNOSIS — Z471 Aftercare following joint replacement surgery: Secondary | ICD-10-CM | POA: Diagnosis not present

## 2017-10-13 DIAGNOSIS — Z8701 Personal history of pneumonia (recurrent): Secondary | ICD-10-CM | POA: Diagnosis not present

## 2017-10-13 DIAGNOSIS — Z96643 Presence of artificial hip joint, bilateral: Secondary | ICD-10-CM | POA: Diagnosis not present

## 2017-10-13 NOTE — Discharge Summary (Signed)
Patient ID: Yolanda Ochoa MRN: [id number] DOB/AGE: [birth year] 56 y.o.  Admit date: 10/08/2017 Discharge date: 10/13/2017  Admission Diagnoses:  Principal Problem:   Status post total replacement of left hip   Discharge Diagnoses:  Same  Past Medical History:  Diagnosis Date  . Anemia    hx of  . Anxiety   . Arthritis   . Asthma   . GERD (gastroesophageal reflux disease)    hx of no meds  . Pneumonia    walking pneumonia    Surgeries: Procedure(s): LEFT TOTAL HIP ARTHROPLASTY ANTERIOR APPROACH on 10/08/2017   Consultants:   Discharged Condition: Improved  Hospital Course: Yolanda FrayMartha R Nolasco is an 56 y.o. female who was admitted 10/08/2017 for operative treatment ofStatus post total replacement of left hip. Patient has severe unremitting pain that affects sleep, daily activities, and work/hobbies. After pre-op clearance the patient was taken to the operating room on 10/08/2017 and underwent  Procedure(s): LEFT TOTAL HIP ARTHROPLASTY ANTERIOR APPROACH.    Patient was given perioperative antibiotics:  Anti-infectives (From admission, onward)   Start     Dose/Rate Route Frequency Ordered Stop   10/08/17 1600  clindamycin (CLEOCIN) IVPB 600 mg     600 mg 100 mL/hr over 30 Minutes Intravenous Every 6 hours 10/08/17 1318 10/08/17 2143   10/08/17 0712  clindamycin (CLEOCIN) 900 MG/50ML IVPB    Comments:  Curlene DolphinWhitlow, Cheryl   : cabinet override      10/08/17 [id number] 10/08/17 0929   10/08/17 0702  clindamycin (CLEOCIN) IVPB 900 mg     900 mg 100 mL/hr over 30 Minutes Intravenous On call to O.R. 10/08/17 [id number] 10/08/17 0944       Patient was given sequential compression devices, early ambulation, and chemoprophylaxis to prevent DVT.  Patient benefited maximally from hospital stay and there were no complications.    Recent vital signs: No data found.   Recent laboratory studies: No results for input(s): WBC, HGB, HCT, PLT, NA, K, CL, CO2, BUN, CREATININE, GLUCOSE, INR, CALCIUM in  the last 72 hours.  Invalid input(s): PT, 2   Discharge Medications:   Allergies as of 10/10/2017      Reactions   Codeine Rash   Penicillins Rash, Other (See Comments)   Has patient had a PCN reaction causing immediate rash, facial/tongue/throat swelling, SOB or lightheadedness with hypotension: No Has patient had a PCN reaction causing severe rash involving mucus membranes or skin necrosis: No Has patient had a PCN reaction that required hospitalization: No Has patient had a PCN reaction occurring within the last 10 years: No If all of the above answers are "NO", then may proceed with Cephalosporin use.      Medication List    STOP taking these medications   aspirin 81 MG chewable tablet Replaced by:  aspirin 325 MG EC tablet   traMADol 50 MG tablet Commonly known as:  ULTRAM     TAKE these medications   acetaminophen 650 MG CR tablet Commonly known as:  TYLENOL Take 1,300 mg by mouth every 8 (eight) hours as needed for pain.   aspirin 325 MG EC tablet Take 1 tablet (325 mg total) by mouth daily with breakfast. Replaces:  aspirin 81 MG chewable tablet   BLUE-EMU SUPER STRENGTH EX Apply 1 application topically as needed (for pain).   buPROPion 150 MG 24 hr tablet Commonly known as:  WELLBUTRIN XL Take 150 mg by mouth daily.   CALCIUM PO Take 1 tablet by mouth daily.   fluticasone 50  MCG/ACT nasal spray Commonly known as:  FLONASE Place 2 sprays into both nostrils daily as needed for allergies or rhinitis.   Fluticasone-Salmeterol 250-50 MCG/DOSE Aepb Commonly known as:  ADVAIR Inhale 2 puffs into the lungs 2 (two) times daily as needed (for shortnes of breath).   lidocaine 4 % external solution Commonly known as:  XYLOCAINE Apply 5 mLs topically as needed for mild pain.   meloxicam 15 MG tablet Commonly known as:  MOBIC Take 15 mg by mouth daily.   oxyCODONE-acetaminophen 5-325 MG tablet Commonly known as:  PERCOCET/ROXICET Take 1-2 tablets by mouth  every 4 (four) hours as needed for severe pain.   tiZANidine 4 MG tablet Commonly known as:  ZANAFLEX Take 1 tablet (4 mg total) by mouth every 8 (eight) hours as needed for muscle spasms.   TURMERIC PO Take 1 capsule by mouth 2 (two) times daily.   Vitamin D3 2000 units Tabs Take 2,000 Units by mouth daily.       Diagnostic Studies: Dg Pelvis Portable  Result Date: 10/08/2017 CLINICAL DATA:  Postop left hip replacement. EXAM: PORTABLE PELVIS 1-2 VIEWS COMPARISON:  Intraoperative spot fluoro films from earlier the same day. FINDINGS: AP view of the lower pelvis and upper thighs shows the patient be status post bilateral hip replacement. Left prosthesis shows no evidence for immediate complication. Gas in the soft tissues of the left hip region is compatible with the immediate postoperative state. IMPRESSION: Status post left total hip replacement earlier today without evidence for immediate hardware complications. Electronically Signed   By: Kennith CenterEric  Mansell M.D.   On: 10/08/2017 11:27   Dg C-arm 1-60 Min-no Report  Result Date: 10/08/2017 Fluoroscopy was utilized by the requesting physician.  No radiographic interpretation.   Dg Hip Operative Unilat W Or W/o Pelvis Left  Result Date: 10/08/2017 CLINICAL DATA:  Osteoarthritis of the left hip. EXAM: OPERATIVE LEFT HIP (WITH PELVIS IF PERFORMED) 2 VIEWS TECHNIQUE: Fluoroscopic spot image(s) were submitted for interpretation post-operatively. COMPARISON:  RADIOGRAPHS DATED 11/19/2016 FINDINGS: The components of a left total hip prosthesis appear in excellent position in the AP projection. No fractures. IMPRESSION: Satisfactory appearance of the left hip in the AP projection after total hip prosthesis insertion. Electronically Signed   By: Francene BoyersJames  Maxwell M.D.   On: 10/08/2017 10:53    Disposition: 01-Home or Self Care    Follow-up Information    Kathryne HitchBlackman, Christopher Y, MD Follow up in 2 week(s).   Specialty:  Orthopedic  Surgery Contact information: [address] [place] [address] [phone]        [facility] Follow up.   Specialty:  Home Health Services Why:  Home Health Physical Therapy-agency will call to arrange initial visit Contact information: [address] [place] [address] [phone]            Signed: Richardean CanalGILBERT Roby Donaway 10/13/2017, 8:49 AM

## 2017-10-15 DIAGNOSIS — Z7951 Long term (current) use of inhaled steroids: Secondary | ICD-10-CM | POA: Diagnosis not present

## 2017-10-15 DIAGNOSIS — Z96643 Presence of artificial hip joint, bilateral: Secondary | ICD-10-CM | POA: Diagnosis not present

## 2017-10-15 DIAGNOSIS — Z8701 Personal history of pneumonia (recurrent): Secondary | ICD-10-CM | POA: Diagnosis not present

## 2017-10-15 DIAGNOSIS — J45909 Unspecified asthma, uncomplicated: Secondary | ICD-10-CM | POA: Diagnosis not present

## 2017-10-15 DIAGNOSIS — Z9181 History of falling: Secondary | ICD-10-CM | POA: Diagnosis not present

## 2017-10-15 DIAGNOSIS — Z471 Aftercare following joint replacement surgery: Secondary | ICD-10-CM | POA: Diagnosis not present

## 2017-10-15 DIAGNOSIS — D649 Anemia, unspecified: Secondary | ICD-10-CM | POA: Diagnosis not present

## 2017-10-15 DIAGNOSIS — F419 Anxiety disorder, unspecified: Secondary | ICD-10-CM | POA: Diagnosis not present

## 2017-10-18 DIAGNOSIS — J45909 Unspecified asthma, uncomplicated: Secondary | ICD-10-CM | POA: Diagnosis not present

## 2017-10-18 DIAGNOSIS — F419 Anxiety disorder, unspecified: Secondary | ICD-10-CM | POA: Diagnosis not present

## 2017-10-18 DIAGNOSIS — Z9181 History of falling: Secondary | ICD-10-CM | POA: Diagnosis not present

## 2017-10-18 DIAGNOSIS — Z96643 Presence of artificial hip joint, bilateral: Secondary | ICD-10-CM | POA: Diagnosis not present

## 2017-10-18 DIAGNOSIS — Z7951 Long term (current) use of inhaled steroids: Secondary | ICD-10-CM | POA: Diagnosis not present

## 2017-10-18 DIAGNOSIS — D649 Anemia, unspecified: Secondary | ICD-10-CM | POA: Diagnosis not present

## 2017-10-18 DIAGNOSIS — Z8701 Personal history of pneumonia (recurrent): Secondary | ICD-10-CM | POA: Diagnosis not present

## 2017-10-18 DIAGNOSIS — Z471 Aftercare following joint replacement surgery: Secondary | ICD-10-CM | POA: Diagnosis not present

## 2017-10-20 DIAGNOSIS — Z471 Aftercare following joint replacement surgery: Secondary | ICD-10-CM | POA: Diagnosis not present

## 2017-10-20 DIAGNOSIS — J45909 Unspecified asthma, uncomplicated: Secondary | ICD-10-CM | POA: Diagnosis not present

## 2017-10-20 DIAGNOSIS — Z9181 History of falling: Secondary | ICD-10-CM | POA: Diagnosis not present

## 2017-10-20 DIAGNOSIS — Z8701 Personal history of pneumonia (recurrent): Secondary | ICD-10-CM | POA: Diagnosis not present

## 2017-10-20 DIAGNOSIS — Z7951 Long term (current) use of inhaled steroids: Secondary | ICD-10-CM | POA: Diagnosis not present

## 2017-10-20 DIAGNOSIS — Z96643 Presence of artificial hip joint, bilateral: Secondary | ICD-10-CM | POA: Diagnosis not present

## 2017-10-20 DIAGNOSIS — F419 Anxiety disorder, unspecified: Secondary | ICD-10-CM | POA: Diagnosis not present

## 2017-10-20 DIAGNOSIS — D649 Anemia, unspecified: Secondary | ICD-10-CM | POA: Diagnosis not present

## 2017-10-21 ENCOUNTER — Encounter (INDEPENDENT_AMBULATORY_CARE_PROVIDER_SITE_OTHER): Payer: Self-pay | Admitting: Orthopaedic Surgery

## 2017-10-21 ENCOUNTER — Ambulatory Visit (INDEPENDENT_AMBULATORY_CARE_PROVIDER_SITE_OTHER): Payer: BLUE CROSS/BLUE SHIELD | Admitting: Orthopaedic Surgery

## 2017-10-21 DIAGNOSIS — Z96642 Presence of left artificial hip joint: Secondary | ICD-10-CM

## 2017-10-21 MED ORDER — OXYCODONE-ACETAMINOPHEN 5-325 MG PO TABS: 1.0000 | ORAL_TABLET | Freq: Four times a day (QID) | ORAL | 0 refills | Status: AC | PRN

## 2017-10-21 NOTE — Progress Notes (Signed)
The patient is 2 weeks tomorrow status post a left total hip arthroplasty directions approach.  She says she is doing very well overall.  She is ambling with a cane and has no other significant issues.  She does need a refill of pain medication though.  On examination her leg lengths are equal.  She has had previous right total hip arthroplasty this year by us.  Her left hip incision looks great.  I removed the old Steri-Strips in place new Steri-Strips.  There is no significant seroma at all.  She does have knee swelling secondary to the surgery.  At this point she can stop her aspirin and continue working on her mobility and wearing her TED hose.  I did refill her oxycodone.  Questions concerns were answered and addressed.  I will see her back in a month as he is doing overall but no x-rays are needed.

## 2017-10-22 DIAGNOSIS — Z96643 Presence of artificial hip joint, bilateral: Secondary | ICD-10-CM | POA: Diagnosis not present

## 2017-10-22 DIAGNOSIS — J45909 Unspecified asthma, uncomplicated: Secondary | ICD-10-CM | POA: Diagnosis not present

## 2017-10-22 DIAGNOSIS — Z7951 Long term (current) use of inhaled steroids: Secondary | ICD-10-CM | POA: Diagnosis not present

## 2017-10-22 DIAGNOSIS — Z8701 Personal history of pneumonia (recurrent): Secondary | ICD-10-CM | POA: Diagnosis not present

## 2017-10-22 DIAGNOSIS — D649 Anemia, unspecified: Secondary | ICD-10-CM | POA: Diagnosis not present

## 2017-10-22 DIAGNOSIS — F419 Anxiety disorder, unspecified: Secondary | ICD-10-CM | POA: Diagnosis not present

## 2017-10-22 DIAGNOSIS — Z9181 History of falling: Secondary | ICD-10-CM | POA: Diagnosis not present

## 2017-10-22 DIAGNOSIS — Z471 Aftercare following joint replacement surgery: Secondary | ICD-10-CM | POA: Diagnosis not present

## 2017-11-01 ENCOUNTER — Other Ambulatory Visit (INDEPENDENT_AMBULATORY_CARE_PROVIDER_SITE_OTHER): Payer: Self-pay | Admitting: Orthopaedic Surgery

## 2017-11-01 NOTE — Telephone Encounter (Signed)
Please advise 

## 2017-11-22 ENCOUNTER — Ambulatory Visit (INDEPENDENT_AMBULATORY_CARE_PROVIDER_SITE_OTHER): Payer: BLUE CROSS/BLUE SHIELD | Admitting: Orthopaedic Surgery

## 2017-11-22 ENCOUNTER — Encounter (INDEPENDENT_AMBULATORY_CARE_PROVIDER_SITE_OTHER): Payer: Self-pay | Admitting: Orthopaedic Surgery

## 2017-11-22 DIAGNOSIS — Z96642 Presence of left artificial hip joint: Secondary | ICD-10-CM

## 2017-11-22 DIAGNOSIS — Z96641 Presence of right artificial hip joint: Secondary | ICD-10-CM

## 2017-11-22 NOTE — Progress Notes (Signed)
The patient is now 45 days status post a left total hip arthroplasty and 101 days status post a right total hip arthroplasty.  She says she has her life back and she is doing great.  She is having normal aches and pains she states that so far she is much better than she was before surgery.  On exam her ligaments are equal.  Incisions look great.  She has some slight numbness around her incisions and this is to be expected.  She tolerates range of motion easily both hips.  She is walking without a limp.  At this point we do not need to see her back for 6 months unless she is having any issues.  I would like a low AP pelvis and lateral both hips at that visit.  All questions and concerns were answered and addressed.

## 2018-01-13 DIAGNOSIS — Z0000 Encounter for general adult medical examination without abnormal findings: Secondary | ICD-10-CM | POA: Diagnosis not present

## 2018-01-20 DIAGNOSIS — Z0000 Encounter for general adult medical examination without abnormal findings: Secondary | ICD-10-CM | POA: Diagnosis not present

## 2018-01-20 DIAGNOSIS — F411 Generalized anxiety disorder: Secondary | ICD-10-CM | POA: Diagnosis not present

## 2018-01-20 DIAGNOSIS — M545 Low back pain: Secondary | ICD-10-CM | POA: Diagnosis not present

## 2018-01-20 DIAGNOSIS — F5101 Primary insomnia: Secondary | ICD-10-CM | POA: Diagnosis not present

## 2018-05-17 DIAGNOSIS — Z01419 Encounter for gynecological examination (general) (routine) without abnormal findings: Secondary | ICD-10-CM | POA: Diagnosis not present

## 2018-05-17 DIAGNOSIS — Z1231 Encounter for screening mammogram for malignant neoplasm of breast: Secondary | ICD-10-CM | POA: Diagnosis not present

## 2018-05-17 DIAGNOSIS — Z1151 Encounter for screening for human papillomavirus (HPV): Secondary | ICD-10-CM | POA: Diagnosis not present

## 2018-05-17 DIAGNOSIS — Z6830 Body mass index (BMI) 30.0-30.9, adult: Secondary | ICD-10-CM | POA: Diagnosis not present

## 2018-05-23 ENCOUNTER — Ambulatory Visit (INDEPENDENT_AMBULATORY_CARE_PROVIDER_SITE_OTHER): Payer: BLUE CROSS/BLUE SHIELD | Admitting: Orthopaedic Surgery

## 2018-05-23 ENCOUNTER — Encounter (INDEPENDENT_AMBULATORY_CARE_PROVIDER_SITE_OTHER): Payer: Self-pay | Admitting: Orthopaedic Surgery

## 2018-05-23 ENCOUNTER — Ambulatory Visit (INDEPENDENT_AMBULATORY_CARE_PROVIDER_SITE_OTHER): Payer: Self-pay

## 2018-05-23 DIAGNOSIS — Z96643 Presence of artificial hip joint, bilateral: Secondary | ICD-10-CM | POA: Diagnosis not present

## 2018-05-23 NOTE — Progress Notes (Signed)
The patient is following up after having both her hips replaced.  They replaced about 2 months apart.  One hip was replaced 9 months ago and the other 7 months ago.  She is doing excellent overall she states.  She has no significant pain at all.  She is able to do whatever she also do without any difficulties.  On examination she walks without any type of limp at all.  Her leg lengths are equal.  I can easily put hips through full range of motion with light difficulty at all.  An AP pelvis and lateral both hips were obtained and show well-seated implants with no complicating features.  At this point she will continue to do activities as tolerates.  All questions concerns were answered and addressed.  I talked her about concerns I would have that we need to bring her back to be seen for her hips but we would not need x-ray them year to year.  We would only x-ray hips again if she is having any significant pain or problems.  These were addressed to her in detail.  All questions concerns were answered and addressed.  Follow-up for her hips can be as needed.

## 2018-06-08 DIAGNOSIS — Z780 Asymptomatic menopausal state: Secondary | ICD-10-CM | POA: Diagnosis not present

## 2018-07-24 IMAGING — DX DG PORTABLE PELVIS
1 series · 1 of 1 positions shown · non-contrast
Comparison: Intraoperative exam 08/13/2017. Preoperative exam
08/03/2017.

CLINICAL DATA: 55-year-old female post right hip replacement.
Subsequent encounter.

EXAM:
PORTABLE PELVIS 1-2 VIEWS

[pelvis ap]
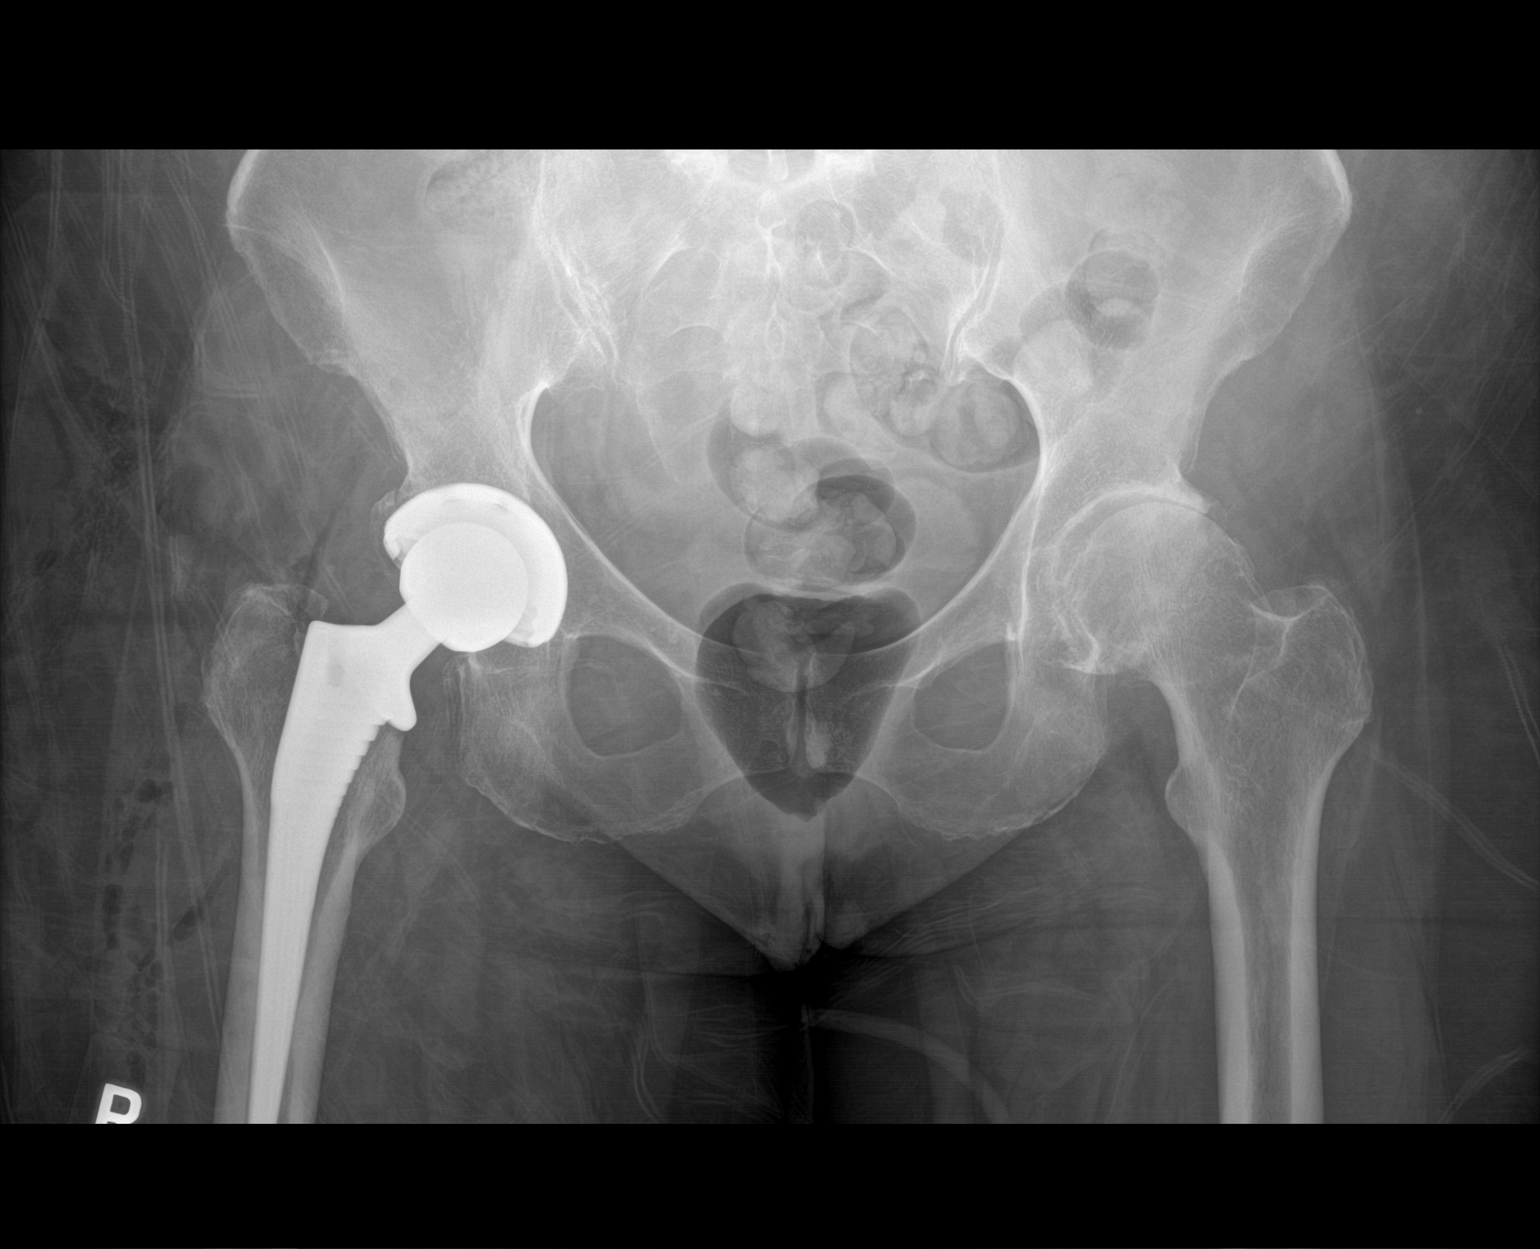

[1 of 1 positions shown; findings below may reference images not displayed]

FINDINGS: Right total hip replacement appears in satisfactory position on this
single projection. There may be slight fragmentation of the right
lateral acetabular osteophyte versus crossing artifact.

Prominent left hip joint degenerative changes.
IMPRESSION: Right total hip replacement appears in satisfactory position on this
single projection. There may be slight fragmentation of the right
lateral acetabular osteophyte versus crossing artifact.

## 2018-09-18 IMAGING — DX DG PORTABLE PELVIS
1 series · 1 of 1 positions shown · non-contrast
Comparison: Intraoperative spot fluoro films from earlier the same
day.

CLINICAL DATA: Postop left hip replacement.

EXAM:
PORTABLE PELVIS 1-2 VIEWS

[pelvis ap]
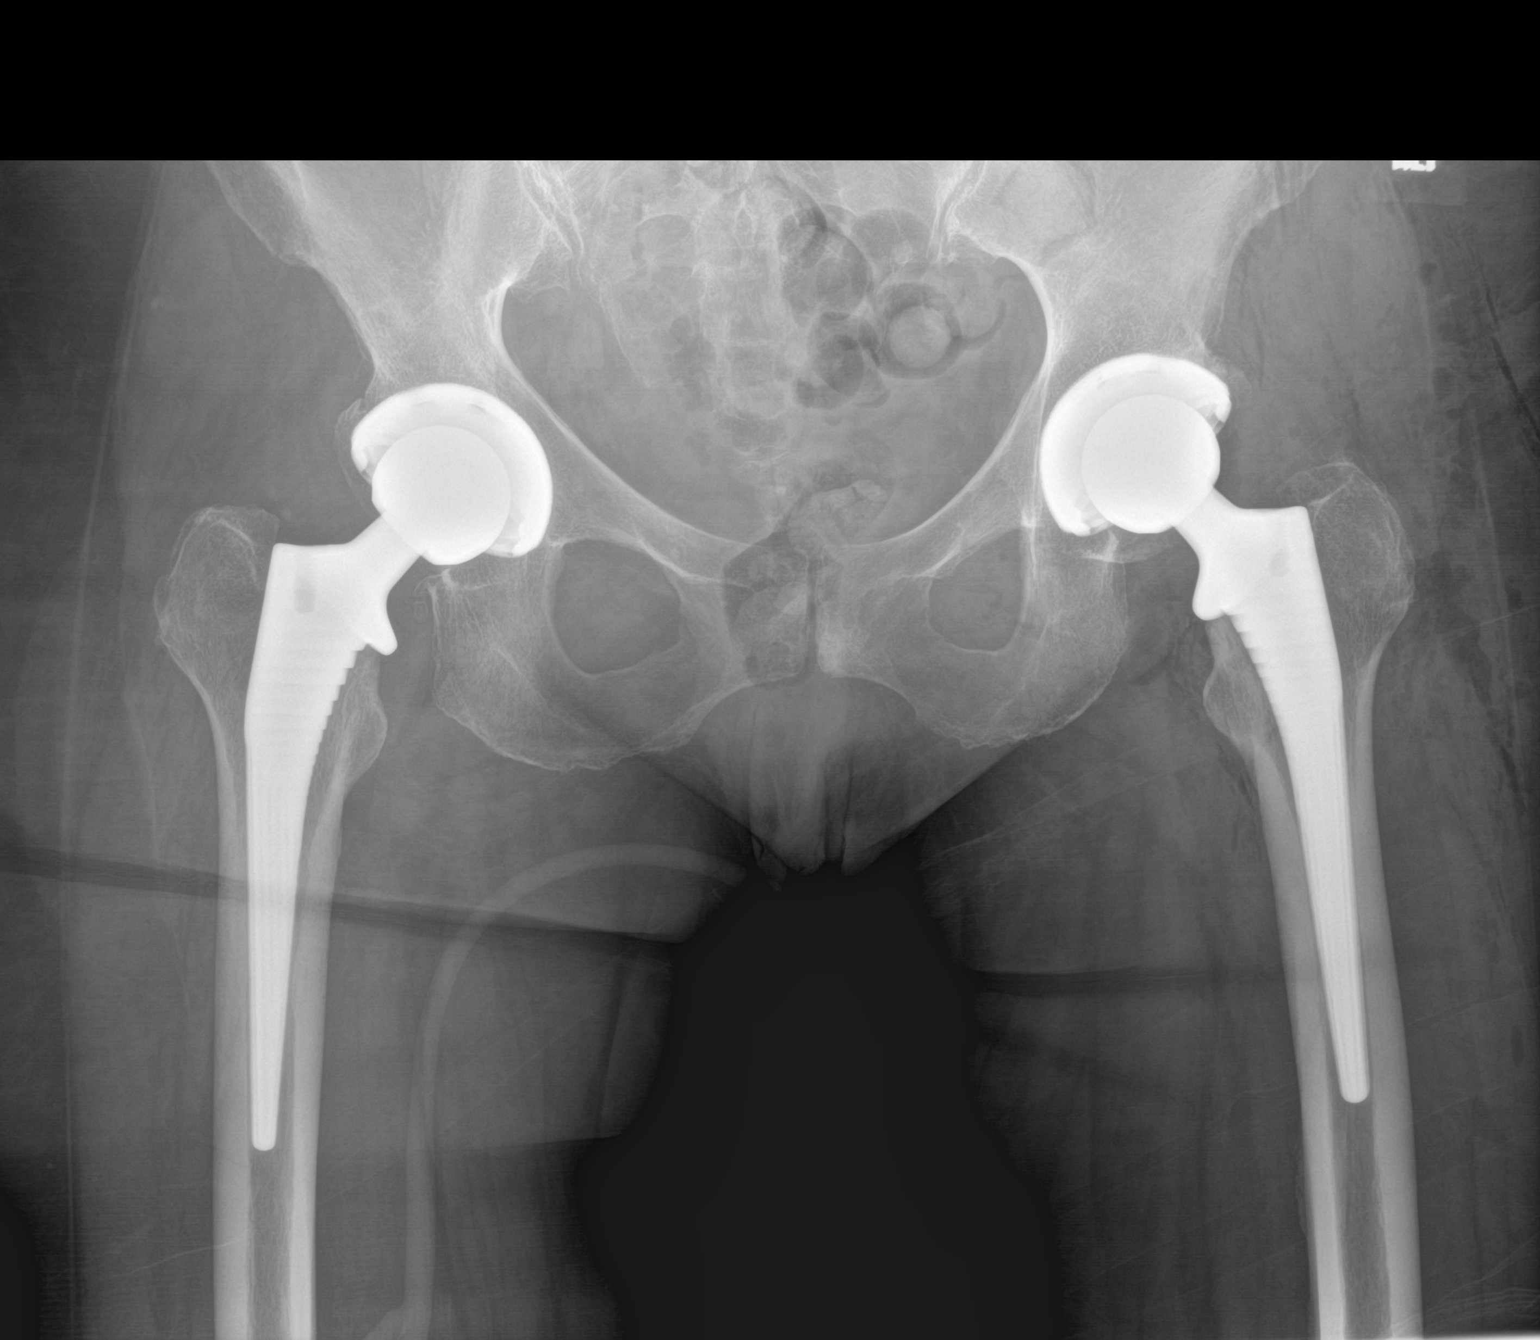

[1 of 1 positions shown; findings below may reference images not displayed]

FINDINGS: AP view of the lower pelvis and upper thighs shows the patient be
status post bilateral hip replacement. Left prosthesis shows no
evidence for immediate complication. Gas in the soft tissues of the
left hip region is compatible with the immediate postoperative
state.
IMPRESSION: Status post left total hip replacement earlier today without
evidence for immediate hardware complications.

## 2018-09-26 DIAGNOSIS — H10413 Chronic giant papillary conjunctivitis, bilateral: Secondary | ICD-10-CM | POA: Diagnosis not present

## 2018-11-09 DIAGNOSIS — Z23 Encounter for immunization: Secondary | ICD-10-CM | POA: Diagnosis not present

## 2019-02-04 ENCOUNTER — Other Ambulatory Visit (INDEPENDENT_AMBULATORY_CARE_PROVIDER_SITE_OTHER): Payer: Self-pay | Admitting: Orthopaedic Surgery

## 2019-02-06 ENCOUNTER — Ambulatory Visit (INDEPENDENT_AMBULATORY_CARE_PROVIDER_SITE_OTHER): Payer: Self-pay

## 2019-02-06 ENCOUNTER — Other Ambulatory Visit: Payer: Self-pay

## 2019-02-06 ENCOUNTER — Ambulatory Visit (INDEPENDENT_AMBULATORY_CARE_PROVIDER_SITE_OTHER): Payer: BLUE CROSS/BLUE SHIELD | Admitting: Orthopaedic Surgery

## 2019-02-06 ENCOUNTER — Telehealth (INDEPENDENT_AMBULATORY_CARE_PROVIDER_SITE_OTHER): Payer: Self-pay

## 2019-02-06 ENCOUNTER — Encounter (INDEPENDENT_AMBULATORY_CARE_PROVIDER_SITE_OTHER): Payer: Self-pay | Admitting: Orthopaedic Surgery

## 2019-02-06 DIAGNOSIS — M5441 Lumbago with sciatica, right side: Secondary | ICD-10-CM

## 2019-02-06 MED ORDER — METHYLPREDNISOLONE 4 MG PO TABS: ORAL_TABLET | ORAL | 0 refills | Status: DC

## 2019-02-06 MED ORDER — TIZANIDINE HCL 4 MG PO TABS: 4.0000 mg | ORAL_TABLET | Freq: Three times a day (TID) | ORAL | 0 refills | Status: DC | PRN

## 2019-02-06 MED ORDER — HYDROCODONE-ACETAMINOPHEN 5-325 MG PO TABS: 1.0000 | ORAL_TABLET | Freq: Four times a day (QID) | ORAL | 0 refills | Status: AC | PRN

## 2019-02-06 NOTE — Progress Notes (Signed)
Office Visit Note   Patient: Yolanda Ochoa           Date of Birth: 07/26/1961           MRN: [id number] Visit Date: 02/06/2019              Requested by: Georgianne Fick, MD [address] [place], [place] [address] PCP: Georgianne Fick, MD   Assessment & Plan: Visit Diagnoses:  1. Acute right-sided low back pain with right-sided sciatica     Plan: I did put her on a 6-day steroid taper as well as some hydrocodone and Zanaflex.  She will work on core strengthening exercises and just stretching her lumbar spine and rolling against a foam hard yoga type of pad against her back.  I will see her back in a week to make sure she is doing well.  If we feel like she needs some type of intervention such as a facet injection or an ESI we will then have Dr. Alvester Morin evaluate her.  All questions concerns were answered and addressed.  We will see how she is doing weeks from now.  I did send the medications in.  Follow-Up Instructions: Return in about 1 week (around 02/13/2019).   Orders:  Orders Placed This Encounter  Procedures  . XR Lumbar Spine 2-3 Views   Meds ordered this encounter  Medications  . methylPREDNISolone (MEDROL) 4 MG tablet    Sig: Medrol dose pack. Take as instructed    Dispense:  21 tablet    Refill:  0  . HYDROcodone-acetaminophen (NORCO/VICODIN) 5-325 MG tablet    Sig: Take 1-2 tablets by mouth every 6 (six) hours as needed for moderate pain.    Dispense:  30 tablet    Refill:  0  . tiZANidine (ZANAFLEX) 4 MG tablet    Sig: Take 1 tablet (4 mg total) by mouth every 8 (eight) hours as needed for muscle spasms.    Dispense:  30 tablet    Refill:  0      Procedures: No procedures performed   Clinical Data: No additional findings.   Subjective: Chief Complaint  Patient presents with  . Lower Back - Pain  The patient is very well-known to me.  She comes in today with a 4-day history of quite significant low back pain to the right side  that is radiating into her right hip and down her right leg.  She had moved wrong several weeks ago and that helped a niece move and was lifting a lot of heavy objects and doing a lot of outdoor work when she started to develop uncomfortable feeling in her back and her right hip area.  She does have a history of bilateral hip replacements.  Last 4 days though is gotten significantly worse and is a lot of pain in her lower back and her sciatic region with all movements.  She is more comfortable laying down.  She has been trying ice and heat and had some old pain medications at home.  She is not a smoker not a diabetic.  She denies any change in bowel bladder function.  She denies any numbness and tingling in her feet or weakness in her lower extremities.  HPI  Review of Systems She currently denies any headache, chest pain, shortness of breath, fever, chills, nausea, vomiting  Objective: Vital Signs: LMP 08/21/2013   Physical Exam She is alert and orient x3 and in no acute distress Ortho Exam Examination of  her hip shows fluid range of motion of both hips with no pain in the groin on either side.  She has a negative straight leg raise bilaterally and normal motor function and strength in her bilateral lower extremities all the way to her feet.  She has good sensation which seems to be equal and normal as well.  She has significant pain to palpation along the lower aspect of her lumbar spine to the right side and this radiates into the sciatic region.  Again her hip exams are normal. Specialty Comments:  No specialty comments available.  Imaging: Xr Lumbar Spine 2-3 Views  Result Date: 02/06/2019 2 views of the lumbar spine show no acute findings.  There is arthritic changes in the posterior elements of the lower lumbar spine.  There is a grade 1 very slight anterolisthesis at L4-L5    PMFS History: Patient Active Problem List   Diagnosis Date Noted  . Status post total replacement of left  hip 10/08/2017  . Unilateral primary osteoarthritis, right hip 08/13/2017  . Status post total replacement of right hip 08/13/2017  . Unilateral primary osteoarthritis, left hip 02/22/2017  . Pain of left hip joint 02/22/2017   Past Medical History:  Diagnosis Date  . Anemia    hx of  . Anxiety   . Arthritis   . Asthma   . GERD (gastroesophageal reflux disease)    hx of no meds  . Pneumonia    walking pneumonia    History reviewed. No pertinent family history.  Past Surgical History:  Procedure Laterality Date  . fatty tumor removed     back of neck and R shoulder  . FOOT SURGERY     bil bunion and hammer toe repair  . NASAL SINUS SURGERY    . TONSILLECTOMY    . TOTAL HIP ARTHROPLASTY Right 08/13/2017   Procedure: RIGHT TOTAL HIP ARTHROPLASTY ANTERIOR APPROACH;  Surgeon: Kathryne HitchBlackman, Jerald Hennington Y, MD;  Location: [facility];  Service: Orthopedics;  Laterality: Right;  . TOTAL HIP ARTHROPLASTY Left 10/08/2017   Procedure: LEFT TOTAL HIP ARTHROPLASTY ANTERIOR APPROACH;  Surgeon: Kathryne HitchBlackman, Teriyah Purington Y, MD;  Location: [facility];  Service: Orthopedics;  Laterality: Left;   Social History   Occupational History  . Not on file  Tobacco Use  . Smoking status: Never Smoker  . Smokeless tobacco: Never Used  Substance and Sexual Activity  . Alcohol use: Yes    Alcohol/week: 1.0 standard drinks    Types: 1 Glasses of wine per week    Comment: socially  . Drug use: No  . Sexual activity: Not Currently

## 2019-02-06 NOTE — Telephone Encounter (Signed)
Please advise 

## 2019-02-13 ENCOUNTER — Other Ambulatory Visit: Payer: Self-pay

## 2019-02-13 ENCOUNTER — Encounter (INDEPENDENT_AMBULATORY_CARE_PROVIDER_SITE_OTHER): Payer: Self-pay | Admitting: Orthopaedic Surgery

## 2019-02-13 ENCOUNTER — Ambulatory Visit (INDEPENDENT_AMBULATORY_CARE_PROVIDER_SITE_OTHER): Payer: BLUE CROSS/BLUE SHIELD | Admitting: Orthopaedic Surgery

## 2019-02-13 DIAGNOSIS — M5441 Lumbago with sciatica, right side: Secondary | ICD-10-CM | POA: Diagnosis not present

## 2019-02-13 MED ORDER — TRAMADOL HCL 50 MG PO TABS: 50.0000 mg | ORAL_TABLET | Freq: Four times a day (QID) | ORAL | 0 refills | Status: DC | PRN

## 2019-02-13 MED ORDER — METHYLPREDNISOLONE 4 MG PO TABS: ORAL_TABLET | ORAL | 0 refills | Status: AC

## 2019-02-13 NOTE — Progress Notes (Signed)
The patient is following up after being on a steroid taper for acute right-sided low back pain and sciatica.  She says that is helped greatly.  She says her symptoms are improving each day and after day 3 of steroids she was significantly better.  She still denies any change in bowel bladder function or any weakness going down her right leg but there is still numbness and tingling and some pain in her thigh.  She does have a history of a replacements but those are not an issue.  There is no groin pain.  On exam her exam is improved greatly overall in terms of her right lower extremity from muscle and sensory standpoint.  I would like to send her to outpatient physical therapy to see if they can continue to help her head in the right direction in regards to her back and sciatica.  We will try a little bit more tramadol and another steroid taper and hopefully this will run its course between that and therapy as well as time.  All question concerns were answered addressed.  We can see her back in 4 weeks from now if she still symptomatic to consider whether or not MRI would be needed at that standpoint.

## 2019-03-15 ENCOUNTER — Other Ambulatory Visit: Payer: Self-pay

## 2019-03-15 ENCOUNTER — Encounter: Payer: Self-pay | Admitting: Orthopaedic Surgery

## 2019-03-15 ENCOUNTER — Ambulatory Visit (INDEPENDENT_AMBULATORY_CARE_PROVIDER_SITE_OTHER): Payer: BLUE CROSS/BLUE SHIELD | Admitting: Orthopaedic Surgery

## 2019-03-15 DIAGNOSIS — M5441 Lumbago with sciatica, right side: Secondary | ICD-10-CM | POA: Diagnosis not present

## 2019-03-15 MED ORDER — TRAMADOL HCL 50 MG PO TABS: 50.0000 mg | ORAL_TABLET | Freq: Four times a day (QID) | ORAL | 0 refills | Status: AC | PRN

## 2019-03-15 MED ORDER — TIZANIDINE HCL 4 MG PO TABS: 4.0000 mg | ORAL_TABLET | Freq: Three times a day (TID) | ORAL | 0 refills | Status: AC | PRN

## 2019-03-15 NOTE — Progress Notes (Signed)
Office Visit Note   Patient: Yolanda Ochoa           Date of Birth: 08-23-1961           MRN: [id number] Visit Date: 03/15/2019              Requested by: Georgianne Fickamachandran, Ajith, MD [address] [place], [place] [id number] PCP: Georgianne Fickamachandran, Ajith, MD   Assessment & Plan: Visit Diagnoses:  1. Acute right-sided low back pain with right-sided sciatica     Plan: Due to the fact that she has not been to formal physical therapy recommend that she do a trial of therapy for the next [redacted] weeks along with a home exercise program.  We will refill her medications being Zanaflex and tramadol.  If she does not have further improvement or if her radicular symptoms continue she will call our office and we will obtain an MRI of her lumbar spine to evaluate for HNP as source of her radicular symptoms.  Also for baseline for repeat epidural steroid injections.  Questions encouraged and answered by Dr. Magnus IvanBlackman and myself.  Follow-up on as-needed basis.  Follow-Up Instructions: Return if symptoms worsen or fail to improve.   Orders:  No orders of the defined types were placed in this encounter.  No orders of the defined types were placed in this encounter.     Procedures: No procedures performed   Clinical Data: No additional findings.   Subjective: Chief Complaint  Patient presents with  . Lower Back - Follow-up    HPI [name] returns today for follow-up of her low back pain with radicular symptoms down the right leg.  She states she is much better.  She still having pain in the low back that radiates down the right anterior medial thigh.  Sometimes it does radiate down into her lower leg but does not radiate into the foot.  She is having nightly back pain that does awaken her.  She is tried a home exercise program but has not been to formal physical therapy as of yet.  No bowel bladder dysfunction. Review of Systems Denies any fevers or chills.  Objective: Vital Signs:  LMP 08/21/2013   Physical Exam Constitutional:      Appearance: She is not ill-appearing or diaphoretic.  Pulmonary:     Effort: Pulmonary effort is normal.  Neurological:     Mental Status: She is alert and oriented to person, place, and time.  Psychiatric:        Mood and Affect: Mood normal.        Behavior: Behavior normal.     Ortho Exam Negative straight leg raise bilaterally.  Good range of motion bilateral hips without pain.  5 out of 5 strength throughout lower extremities against resistance.  Specialty Comments:  No specialty comments available.  Imaging: No results found.   PMFS History: Patient Active Problem List   Diagnosis Date Noted  . Status post total replacement of left hip 10/08/2017  . Unilateral primary osteoarthritis, right hip 08/13/2017  . Status post total replacement of right hip 08/13/2017  . Unilateral primary osteoarthritis, left hip 02/22/2017  . Pain of left hip joint 02/22/2017   Past Medical History:  Diagnosis Date  . Anemia    hx of  . Anxiety   . Arthritis   . Asthma   . GERD (gastroesophageal reflux disease)    hx of no meds  . Pneumonia    walking pneumonia  History reviewed. No pertinent family history.  Past Surgical History:  Procedure Laterality Date  . fatty tumor removed     back of neck and R shoulder  . FOOT SURGERY     bil bunion and hammer toe repair  . NASAL SINUS SURGERY    . TONSILLECTOMY    . TOTAL HIP ARTHROPLASTY Right 08/13/2017   Procedure: RIGHT TOTAL HIP ARTHROPLASTY ANTERIOR APPROACH;  Surgeon: Kathryne Hitch, MD;  Location: [facility];  Service: Orthopedics;  Laterality: Right;  . TOTAL HIP ARTHROPLASTY Left 10/08/2017   Procedure: LEFT TOTAL HIP ARTHROPLASTY ANTERIOR APPROACH;  Surgeon: Kathryne Hitch, MD;  Location: [facility];  Service: Orthopedics;  Laterality: Left;   Social History   Occupational History  . Not on file  Tobacco Use  . Smoking status: Never Smoker  .  Smokeless tobacco: Never Used  Substance and Sexual Activity  . Alcohol use: Yes    Alcohol/week: 1.0 standard drinks    Types: 1 Glasses of wine per week    Comment: socially  . Drug use: No  . Sexual activity: Not Currently

## 2019-03-21 DIAGNOSIS — M545 Low back pain: Secondary | ICD-10-CM | POA: Diagnosis not present

## 2019-03-25 ENCOUNTER — Other Ambulatory Visit: Payer: BLUE CROSS/BLUE SHIELD

## 2019-03-25 ENCOUNTER — Telehealth: Payer: Self-pay | Admitting: Internal Medicine

## 2019-03-25 DIAGNOSIS — Z20822 Contact with and (suspected) exposure to covid-19: Secondary | ICD-10-CM

## 2019-03-25 NOTE — Telephone Encounter (Signed)
Appt scheduled for Covid testing

## 2019-03-25 NOTE — Addendum Note (Signed)
Addended by: Lonia Farber E on: 03/25/2019 11:04 AM   Modules accepted: Orders

## 2019-03-25 NOTE — Telephone Encounter (Signed)
RN explained that the patient has had a possible exposure to the Covid-19 virus at her visit to [facility] on 03/15/19.  Offered free Covid-19 Screening to the patient.  Left message for patient to call [facility] Triage to schedule an appointment for Covid-19 Screening.  [phone]

## 2019-03-28 LAB — NOVEL CORONAVIRUS, NAA: SARS-CoV-2, NAA: NOT DETECTED

## 2019-07-06 DIAGNOSIS — Z1231 Encounter for screening mammogram for malignant neoplasm of breast: Secondary | ICD-10-CM | POA: Diagnosis not present

## 2019-07-06 DIAGNOSIS — Z6831 Body mass index (BMI) 31.0-31.9, adult: Secondary | ICD-10-CM | POA: Diagnosis not present

## 2019-07-06 DIAGNOSIS — Z01419 Encounter for gynecological examination (general) (routine) without abnormal findings: Secondary | ICD-10-CM | POA: Diagnosis not present

## 2019-07-20 DIAGNOSIS — Z23 Encounter for immunization: Secondary | ICD-10-CM | POA: Diagnosis not present

## 2019-07-31 DIAGNOSIS — L570 Actinic keratosis: Secondary | ICD-10-CM | POA: Diagnosis not present

## 2019-07-31 DIAGNOSIS — L820 Inflamed seborrheic keratosis: Secondary | ICD-10-CM | POA: Diagnosis not present

## 2019-07-31 DIAGNOSIS — D2371 Other benign neoplasm of skin of right lower limb, including hip: Secondary | ICD-10-CM | POA: Diagnosis not present

## 2019-10-18 ENCOUNTER — Telehealth: Payer: BC Managed Care – PPO | Admitting: Family

## 2019-10-18 DIAGNOSIS — Z20822 Contact with and (suspected) exposure to covid-19: Secondary | ICD-10-CM

## 2019-10-18 MED ORDER — BENZONATATE 100 MG PO CAPS: 100.0000 mg | ORAL_CAPSULE | Freq: Three times a day (TID) | ORAL | 0 refills | Status: AC | PRN

## 2019-10-18 NOTE — Progress Notes (Signed)
E-Visit for Corona Virus Screening   Your current symptoms could be consistent with the coronavirus.  Many health care providers can now test patients at their office but not all are.  [facility] has multiple testing sites. For information on our COVID testing locations and hours go to [email or web address]  We are enrolling you in our MyChart Home Monitoring for COVID19 . Daily you will receive a questionnaire within the MyChart website. Our COVID 19 response team will be monitoring your responses daily.  Testing Information: The COVID-19 Community Testing sites will begin testing BY APPOINTMENT ONLY.  You can schedule online at [email or web address]  If you do not have access to a smart phone or computer you may call [phone] for an appointment.  Testing Locations: Appointment schedule is 8 am to 3:30 pm at all sites   indoors at [address], [place] [address] [facility]  indoors at [address] Vis[address]trance, [place], [place] [address] [place] indoors at [address], [place] [address]  Additional testing sites in the Community:  . For [facility] Testing sites in [place]  https://www.cvs.com/minuteclinic/covid-19-testing  . For Pop-up testing sites in [place]  https://covid19.ncdhhs.gov/about-covid-19/testing/find-my-testing-place/pop-testing-sites  . For Testing sites with regular hours https://onsms.org/[place]/  . For [facility] https://[facility].com/covid-19-community-outreach-testing/  . For [facility] https://www[place].gov[place]/human-services/health-department/coronavirus-covid-19-info/covid-19-testing  . For [place] testing in [place] https://www.[place].gov/[place]/human-services/health-department/coronavirus-covid-19-info/covid-19-testing  . For [facility] testing in  County   https://[facility].care/covidtesting  For  more  information about community testing call [phone]   We are enrolling you in our MyChart Home Monitoring for COVID19 . Daily you will receive a questionnaire within the MyChart website. Our COVID 19 response team will be monitoring your responses daily.  Please quarantine yourself while awaiting your test results. If you develop fever/cough/breathlessness, please stay home for 10 days with improving symptoms and until you have had 24 hours of no fever (without taking a fever reducer).  You should wear a mask or cloth face covering over your nose and mouth if you must be around other people or animals, including pets (even at home). Try to stay at least 6 feet away from other people. This will protect the people around you.  Please continue good preventive care measures, including:  frequent hand-washing, avoid touching your face, cover coughs/sneezes, stay out of crowds and keep a 6 foot distance from others.  COVID-19 is a respiratory illness with symptoms that are similar to the flu. Symptoms are typically mild to moderate, but there have been cases of severe illness and death due to the virus.   The following symptoms may appear 2-14 days after exposure: . Fever . Cough . Shortness of breath or difficulty breathing . Chills . Repeated shaking with chills . Muscle pain . Headache . Sore throat . New loss of taste or smell . Fatigue . Congestion or runny nose . Nausea or vomiting . Diarrhea  Go to the nearest hospital ED for assessment if fever/cough/breathlessness are severe or illness seems like a threat to life.  It is vitally important that if you feel that you have an infection such as this virus or any other virus that you stay home and away from places where you may spread it to others.  You should avoid contact with people age 65 and older.   You can use medication such as A prescription cough medication called Tessalon Perles 100 mg. You may take 1-2 capsules every 8 hours as    needed for cough.  You may also take acetaminophen (Tylenol) as needed for fever.  Reduce your risk of any infection by using the same precautions used for avoiding the common cold or flu:  . Wash your hands often with soap and warm water for at least 20 seconds.  If soap and water are not readily available, use an alcohol-based hand sanitizer with at least 60% alcohol.  . If coughing or sneezing, cover your mouth and nose by coughing or sneezing into the elbow areas of your shirt or coat, into a tissue or into your sleeve (not your hands). . Avoid shaking hands with others and consider head nods or verbal greetings only. . Avoid touching your eyes, nose, or mouth with unwashed hands.  . Avoid close contact with people who are sick. . Avoid places or events with large numbers of people in one location, like concerts or sporting events. . Carefully consider travel plans you have or are making. . If you are planning any travel outside or inside the [place], visit the CDC's Travelers' Health webpage for the latest health notices. . If you have some symptoms but not all symptoms, continue to monitor at home and seek medical attention if your symptoms worsen. . If you are having a medical emergency, call 911.  HOME CARE . Only take medications as instructed by your medical team. . Drink plenty of fluids and get plenty of rest. . A steam or ultrasonic humidifier can help if you have congestion.   GET HELP RIGHT AWAY IF YOU HAVE EMERGENCY WARNING SIGNS** FOR COVID-19. If you or someone is showing any of these signs seek emergency medical care immediately. Call 911 or proceed to your closest emergency facility if: . You develop worsening high fever. . Trouble breathing . Bluish lips or face . Persistent pain or pressure in the chest . New confusion . Inability to wake or stay awake . You cough up blood. . Your symptoms become more severe  **This list is not all possible symptoms. Contact your  medical provider for any symptoms that are sever or concerning to you.  MAKE SURE YOU   Understand these instructions.  Will watch your condition.  Will get help right away if you are not doing well or get worse.  Your e-visit answers were reviewed by a board certified advanced clinical practitioner to complete your personal care plan.  Depending on the condition, your plan could have included both over the counter or prescription medications.  If there is a problem please reply once you have received a response from your provider.  Your safety is important to us.  If you have drug allergies check your prescription carefully.    You can use MyChart to ask questions about today's visit, request a non-urgent call back, or ask for a work or school excuse for 24 hours related to this e-Visit. If it has been greater than 24 hours you will need to follow up with your provider, or enter a new e-Visit to address those concerns. You will get an e-mail in the next two days asking about your experience.  I hope that your e-visit has been valuable and will speed your recovery. Thank you for using e-visits.  Approximately 5 minutes was spent documenting and reviewing patient's chart.    

## 2019-10-27 ENCOUNTER — Encounter (INDEPENDENT_AMBULATORY_CARE_PROVIDER_SITE_OTHER): Payer: Self-pay

## 2020-02-01 ENCOUNTER — Ambulatory Visit: Payer: BC Managed Care – PPO | Attending: Internal Medicine

## 2020-02-01 DIAGNOSIS — Z23 Encounter for immunization: Secondary | ICD-10-CM

## 2020-02-01 NOTE — Progress Notes (Signed)
   Covid-19 Vaccination Clinic  Name:  DILLIE BURANDT    MRN: [id number] DOB: 28-Aug-1961  02/01/2020  Ms. Heupel was observed post Covid-19 immunization for 15 minutes without incident. She was provided with Vaccine Information Sheet and instruction to access the V-Safe system.   Ms. Donald was instructed to call 911 with any severe reactions post vaccine: Marland Kitchen Difficulty breathing  . Swelling of face and throat  . A fast heartbeat  . A bad rash all over body  . Dizziness and weakness   Immunizations Administered    Name Date Dose VIS Date Route   Pfizer COVID-19 Vaccine 02/01/2020 10:28 AM 0.3 mL 10/06/2019 Intramuscular   Manufacturer: [employer], [employer]   Lot: [id number]   NDC: [address]-9

## 2020-02-27 ENCOUNTER — Ambulatory Visit: Payer: BC Managed Care – PPO | Attending: Internal Medicine

## 2020-02-27 DIAGNOSIS — Z23 Encounter for immunization: Secondary | ICD-10-CM

## 2020-02-27 NOTE — Progress Notes (Signed)
   Covid-19 Vaccination Clinic  Name:  TANISHA LUTES    MRN: [id number] DOB: 04/01/1961  02/27/2020  Ms. Neumeister was observed post Covid-19 immunization for 15 minutes without incident. She was provided with Vaccine Information Sheet and instruction to access the V-Safe system.   Ms. Ho was instructed to call 911 with any severe reactions post vaccine: Marland Kitchen Difficulty breathing  . Swelling of face and throat  . A fast heartbeat  . A bad rash all over body  . Dizziness and weakness   Immunizations Administered    Name Date Dose VIS Date Route   Pfizer COVID-19 Vaccine 02/27/2020 10:33 AM 0.3 mL 12/20/2018 Intramuscular   Manufacturer: [employer], [employer]   Lot: [id number]   NDC: [address]-1

## 2020-03-21 DIAGNOSIS — Z13220 Encounter for screening for lipoid disorders: Secondary | ICD-10-CM | POA: Diagnosis not present

## 2020-03-21 DIAGNOSIS — Z0000 Encounter for general adult medical examination without abnormal findings: Secondary | ICD-10-CM | POA: Diagnosis not present

## 2020-03-28 DIAGNOSIS — Z0000 Encounter for general adult medical examination without abnormal findings: Secondary | ICD-10-CM | POA: Diagnosis not present

## 2020-03-28 DIAGNOSIS — E6609 Other obesity due to excess calories: Secondary | ICD-10-CM | POA: Diagnosis not present

## 2020-03-28 DIAGNOSIS — F411 Generalized anxiety disorder: Secondary | ICD-10-CM | POA: Diagnosis not present

## 2020-03-28 DIAGNOSIS — J4520 Mild intermittent asthma, uncomplicated: Secondary | ICD-10-CM | POA: Diagnosis not present

## 2020-03-28 DIAGNOSIS — R7303 Prediabetes: Secondary | ICD-10-CM | POA: Diagnosis not present

## 2020-05-08 DIAGNOSIS — J4520 Mild intermittent asthma, uncomplicated: Secondary | ICD-10-CM | POA: Diagnosis not present

## 2020-05-08 DIAGNOSIS — R05 Cough: Secondary | ICD-10-CM | POA: Diagnosis not present

## 2020-05-08 DIAGNOSIS — R7303 Prediabetes: Secondary | ICD-10-CM | POA: Diagnosis not present

## 2020-05-08 DIAGNOSIS — E6609 Other obesity due to excess calories: Secondary | ICD-10-CM | POA: Diagnosis not present

## 2020-05-23 DIAGNOSIS — E6609 Other obesity due to excess calories: Secondary | ICD-10-CM | POA: Diagnosis not present

## 2020-06-17 DIAGNOSIS — E6609 Other obesity due to excess calories: Secondary | ICD-10-CM | POA: Diagnosis not present

## 2020-06-17 DIAGNOSIS — J4520 Mild intermittent asthma, uncomplicated: Secondary | ICD-10-CM | POA: Diagnosis not present

## 2020-06-17 DIAGNOSIS — F411 Generalized anxiety disorder: Secondary | ICD-10-CM | POA: Diagnosis not present

## 2020-06-17 DIAGNOSIS — R7303 Prediabetes: Secondary | ICD-10-CM | POA: Diagnosis not present

## 2020-08-21 DIAGNOSIS — Z01419 Encounter for gynecological examination (general) (routine) without abnormal findings: Secondary | ICD-10-CM | POA: Diagnosis not present

## 2020-08-21 DIAGNOSIS — Z6829 Body mass index (BMI) 29.0-29.9, adult: Secondary | ICD-10-CM | POA: Diagnosis not present

## 2020-08-21 DIAGNOSIS — Z1231 Encounter for screening mammogram for malignant neoplasm of breast: Secondary | ICD-10-CM | POA: Diagnosis not present

## 2020-12-10 DIAGNOSIS — J0100 Acute maxillary sinusitis, unspecified: Secondary | ICD-10-CM | POA: Diagnosis not present

## 2021-01-12 DIAGNOSIS — M25572 Pain in left ankle and joints of left foot: Secondary | ICD-10-CM | POA: Diagnosis not present

## 2021-01-12 DIAGNOSIS — M7732 Calcaneal spur, left foot: Secondary | ICD-10-CM | POA: Diagnosis not present

## 2021-01-12 DIAGNOSIS — S8265XA Nondisplaced fracture of lateral malleolus of left fibula, initial encounter for closed fracture: Secondary | ICD-10-CM | POA: Diagnosis not present

## 2021-01-29 DIAGNOSIS — M25572 Pain in left ankle and joints of left foot: Secondary | ICD-10-CM | POA: Diagnosis not present

## 2021-01-29 DIAGNOSIS — S8262XA Displaced fracture of lateral malleolus of left fibula, initial encounter for closed fracture: Secondary | ICD-10-CM | POA: Diagnosis not present

## 2021-03-03 DIAGNOSIS — S8262XA Displaced fracture of lateral malleolus of left fibula, initial encounter for closed fracture: Secondary | ICD-10-CM | POA: Diagnosis not present

## 2021-03-03 DIAGNOSIS — M25572 Pain in left ankle and joints of left foot: Secondary | ICD-10-CM | POA: Diagnosis not present

## 2021-06-27 DIAGNOSIS — Z0000 Encounter for general adult medical examination without abnormal findings: Secondary | ICD-10-CM | POA: Diagnosis not present

## 2021-07-02 DIAGNOSIS — M79645 Pain in left finger(s): Secondary | ICD-10-CM | POA: Diagnosis not present

## 2021-07-02 DIAGNOSIS — R7303 Prediabetes: Secondary | ICD-10-CM | POA: Diagnosis not present

## 2021-07-02 DIAGNOSIS — Z23 Encounter for immunization: Secondary | ICD-10-CM | POA: Diagnosis not present

## 2021-07-02 DIAGNOSIS — Z0000 Encounter for general adult medical examination without abnormal findings: Secondary | ICD-10-CM | POA: Diagnosis not present

## 2021-07-02 DIAGNOSIS — E041 Nontoxic single thyroid nodule: Secondary | ICD-10-CM | POA: Diagnosis not present

## 2021-07-02 DIAGNOSIS — G5601 Carpal tunnel syndrome, right upper limb: Secondary | ICD-10-CM | POA: Diagnosis not present

## 2021-07-04 ENCOUNTER — Other Ambulatory Visit: Payer: Self-pay | Admitting: Internal Medicine

## 2021-07-04 DIAGNOSIS — E041 Nontoxic single thyroid nodule: Secondary | ICD-10-CM

## 2021-07-15 ENCOUNTER — Ambulatory Visit
Admission: RE | Admit: 2021-07-15 | Discharge: 2021-07-15 | Disposition: A | Discharge status: Home / Self Care | Payer: BC Managed Care – PPO | Source: Ambulatory Visit | Attending: Internal Medicine | Admitting: Internal Medicine

## 2021-07-15 DIAGNOSIS — E041 Nontoxic single thyroid nodule: Secondary | ICD-10-CM | POA: Diagnosis not present

## 2021-07-17 DIAGNOSIS — E041 Nontoxic single thyroid nodule: Secondary | ICD-10-CM | POA: Diagnosis not present

## 2021-07-17 DIAGNOSIS — Z23 Encounter for immunization: Secondary | ICD-10-CM | POA: Diagnosis not present

## 2021-07-28 ENCOUNTER — Other Ambulatory Visit: Payer: Self-pay | Admitting: Internal Medicine

## 2021-07-28 DIAGNOSIS — R5381 Other malaise: Secondary | ICD-10-CM

## 2021-07-30 ENCOUNTER — Other Ambulatory Visit: Payer: Self-pay | Admitting: Internal Medicine

## 2021-07-30 DIAGNOSIS — E041 Nontoxic single thyroid nodule: Secondary | ICD-10-CM

## 2021-08-20 ENCOUNTER — Other Ambulatory Visit (HOSPITAL_COMMUNITY)
Admission: RE | Admit: 2021-08-20 | Discharge: 2021-08-20 | Disposition: A | Discharge status: Home / Self Care | Payer: BC Managed Care – PPO | Source: Ambulatory Visit | Attending: Internal Medicine | Admitting: Internal Medicine

## 2021-08-20 ENCOUNTER — Ambulatory Visit
Admission: RE | Admit: 2021-08-20 | Discharge: 2021-08-20 | Disposition: A | Discharge status: Home / Self Care | Payer: BC Managed Care – PPO | Source: Ambulatory Visit | Attending: Internal Medicine | Admitting: Internal Medicine

## 2021-08-20 DIAGNOSIS — E041 Nontoxic single thyroid nodule: Secondary | ICD-10-CM | POA: Diagnosis not present

## 2021-08-20 DIAGNOSIS — J4531 Mild persistent asthma with (acute) exacerbation: Secondary | ICD-10-CM | POA: Diagnosis not present

## 2021-08-21 LAB — CYTOLOGY - NON PAP

## 2021-08-26 DIAGNOSIS — Z01419 Encounter for gynecological examination (general) (routine) without abnormal findings: Secondary | ICD-10-CM | POA: Diagnosis not present

## 2021-08-26 DIAGNOSIS — Z113 Encounter for screening for infections with a predominantly sexual mode of transmission: Secondary | ICD-10-CM | POA: Diagnosis not present

## 2021-08-26 DIAGNOSIS — Z1231 Encounter for screening mammogram for malignant neoplasm of breast: Secondary | ICD-10-CM | POA: Diagnosis not present

## 2021-08-26 DIAGNOSIS — Z124 Encounter for screening for malignant neoplasm of cervix: Secondary | ICD-10-CM | POA: Diagnosis not present

## 2021-08-26 DIAGNOSIS — Z803 Family history of malignant neoplasm of breast: Secondary | ICD-10-CM | POA: Diagnosis not present

## 2021-08-26 DIAGNOSIS — Z6830 Body mass index (BMI) 30.0-30.9, adult: Secondary | ICD-10-CM | POA: Diagnosis not present

## 2021-08-29 DIAGNOSIS — E041 Nontoxic single thyroid nodule: Secondary | ICD-10-CM | POA: Diagnosis not present

## 2021-09-02 DIAGNOSIS — E041 Nontoxic single thyroid nodule: Secondary | ICD-10-CM | POA: Diagnosis not present

## 2021-09-10 ENCOUNTER — Encounter (HOSPITAL_COMMUNITY): Payer: Self-pay

## 2021-10-09 DIAGNOSIS — Z23 Encounter for immunization: Secondary | ICD-10-CM | POA: Diagnosis not present

## 2022-06-25 IMAGING — US US THYROID
1 series · 12 of 25 positions shown · non-contrast
Comparison: None.

CLINICAL DATA: Palpable abnormality. Palpable left-sided thyroid
nodule on physical examination.

EXAM:
THYROID ULTRASOUND
TECHNIQUE: Ultrasound examination of the thyroid gland and adjacent soft
tissues was performed.

[Series 1: us thyroid · 0.04mm/px · 12 of 49 slices shown]
[im 3/49]
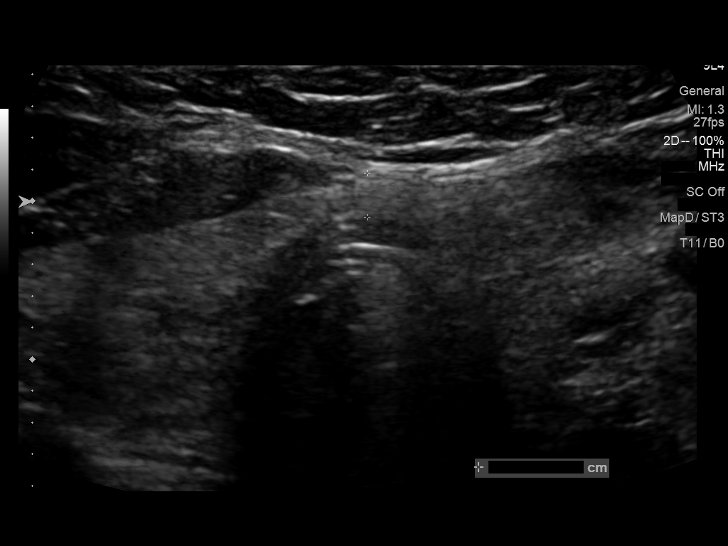
[im 7/49]
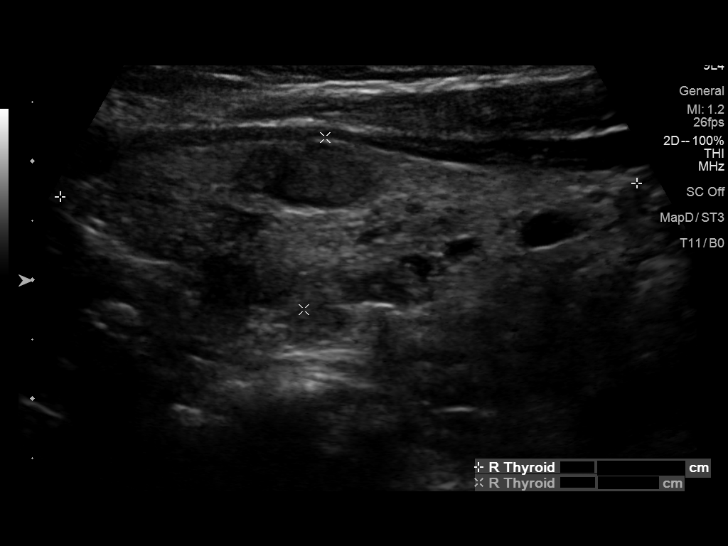
[im 11/49]
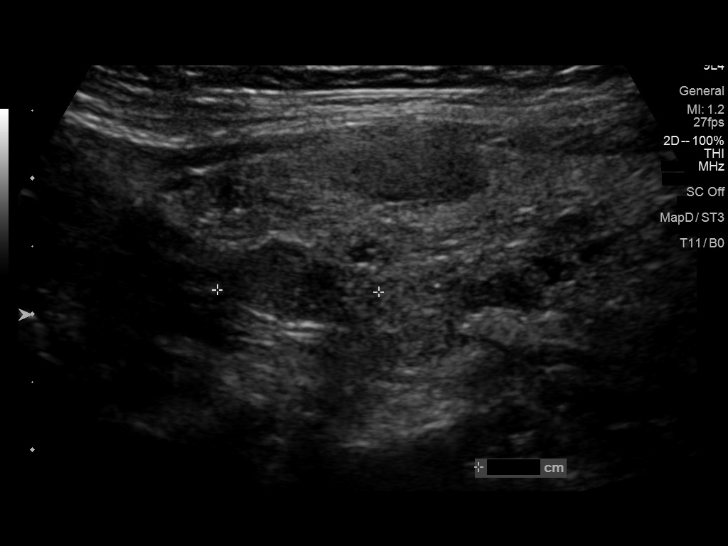
[im 15/49]
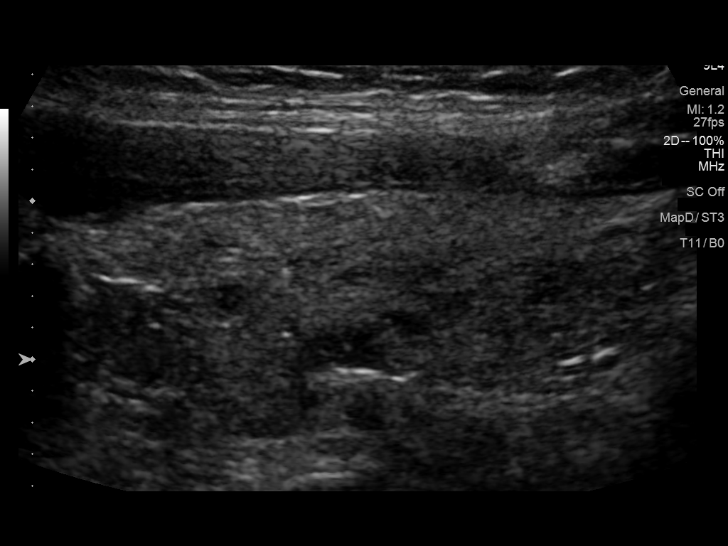
[im 19/49]
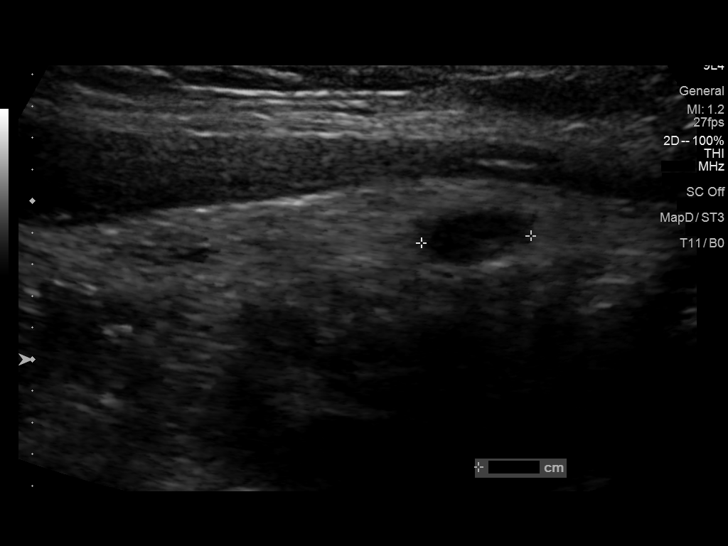
[im 23/49]
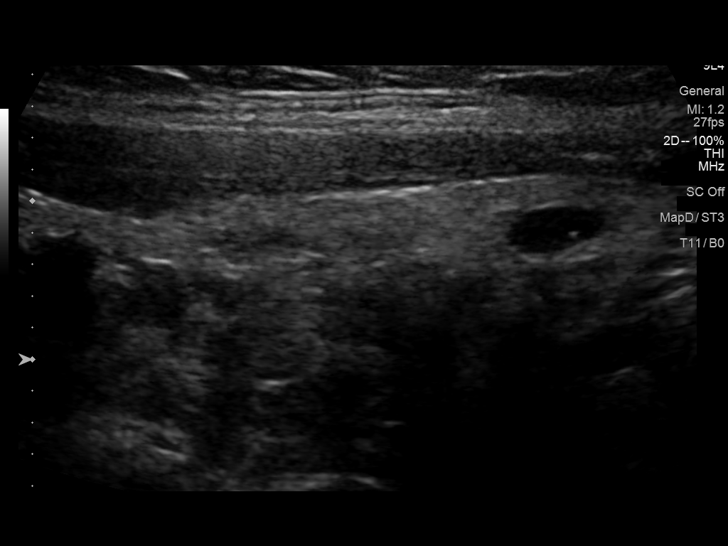
[im 27/49]
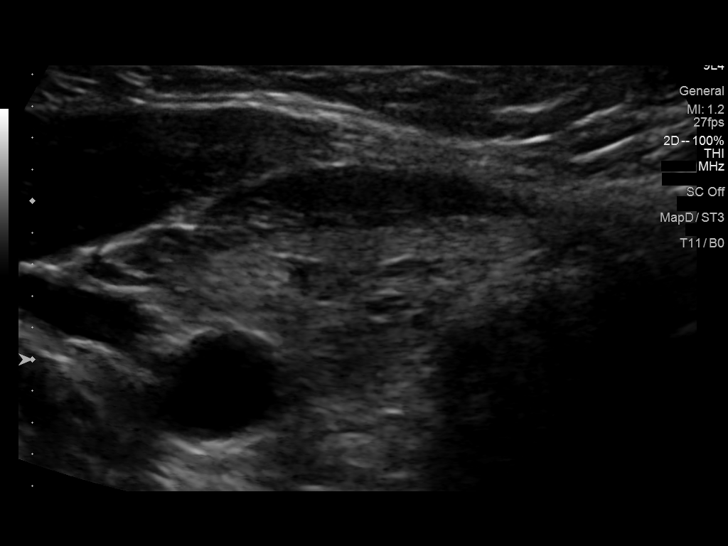
[im 31/49]
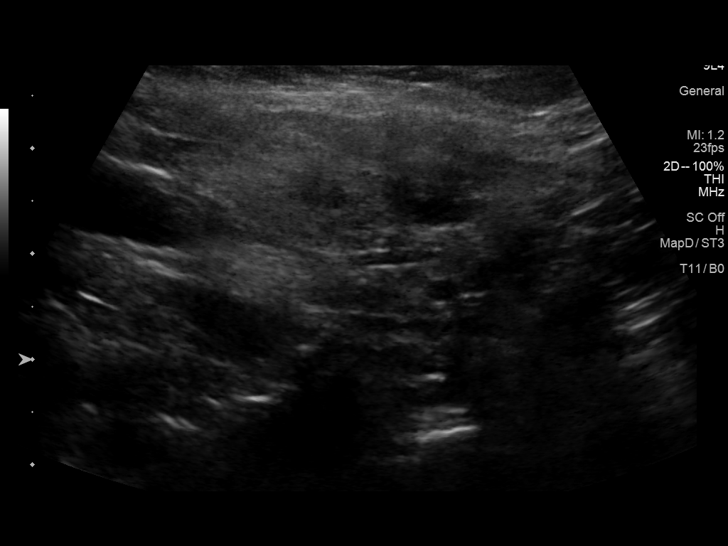
[im 35/49]
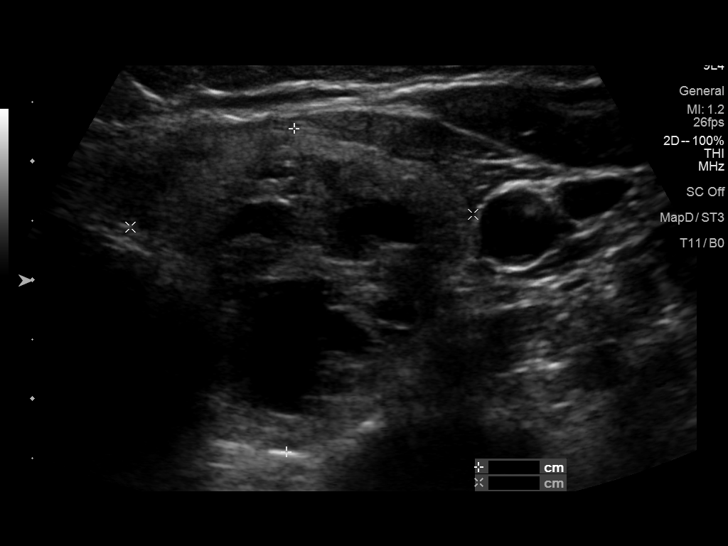
[im 39/49]
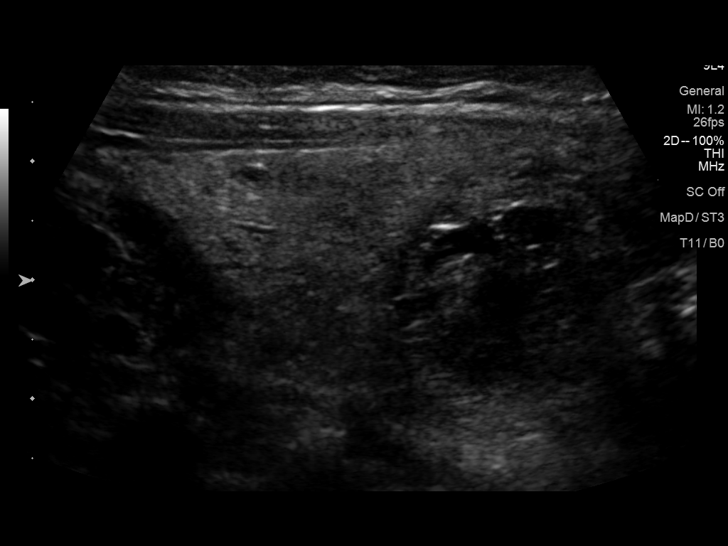
[im 43/49]
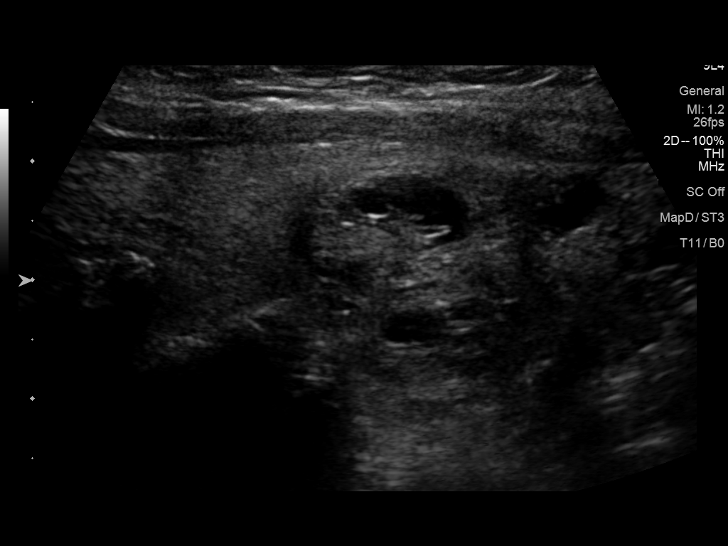
[im 47/49]
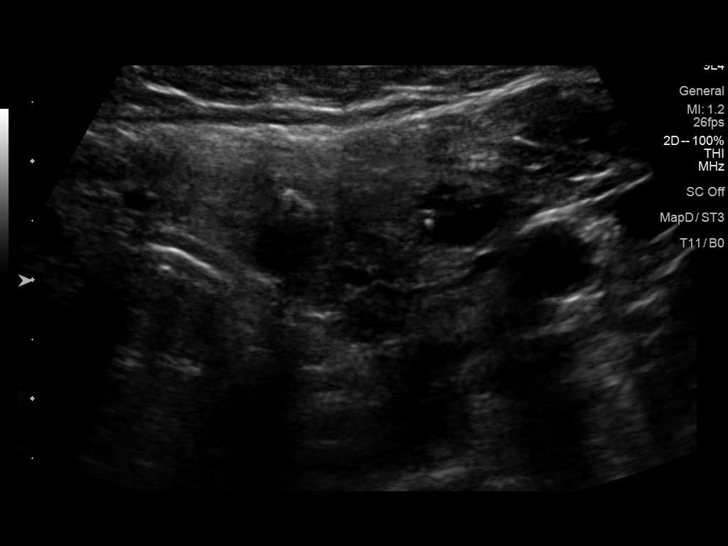

[12 of 25 positions shown; findings below may reference images not displayed]

FINDINGS: Parenchymal Echotexture: Moderately heterogenous

Isthmus: Normal in size measuring 0.3 cm in diameter

Right lobe: Normal in size measuring 4.9 x 1.5 x 2.0 cm

Left lobe: Enlarged measuring 7.2 x 2.6 x 2.5 cm

_________________________________________________________

Estimated total number of nodules >/= 1 cm: 4

Number of spongiform nodules >/=  2 cm not described below (TR1): 0

Number of mixed cystic and solid nodules >/= 1.5 cm not described
below (TR2): 0

_________________________________________________________

There is an approximately 0.9 x 0.7 x 0.6 cm hypoechoic nodule
within the superior pole the right lobe of the thyroid (labeled 1),
which does not meet criteria to recommend percutaneous sampling or
continued dedicated follow-up.

_________________________________________________________

Nodule # 2:

Location: Right; Mid

Maximum size: 1.1 cm; Other 2 dimensions: 0.8 x 0.5 cm

Composition: solid/almost completely solid (2)

Echogenicity: hypoechoic (2)

Shape: not taller-than-wide (0)

Margins: smooth (0)

Echogenic foci: none (0)

ACR TI-RADS total points: 4.

ACR TI-RADS risk category: TR4 (4-6 points).

ACR TI-RADS recommendations:

*Given size (>/= 1 - 1.4 cm) and appearance, a follow-up ultrasound
in 1 year should be considered based on TI-RADS criteria.

_________________________________________________________

There is an approximately 0.7 cm anechoic cyst within the inferior
pole the right lobe of the thyroid (labeled 3), which does not meet
criteria to recommend percutaneous sampling or continued dedicated
follow-up.

_________________________________________________________

Nodule # 4:

Location: Left; Mid

Maximum size: 2.2 cm; Other 2 dimensions: 1.8 x 1.0 cm

Composition: solid/almost completely solid (2)

Echogenicity: hypoechoic (2)

Shape: not taller-than-wide (0)

Margins: ill-defined (0)

Echogenic foci: none (0)

ACR TI-RADS total points: 4.

ACR TI-RADS risk category: TR4 (4-6 points).

ACR TI-RADS recommendations:

**Given size (>/= 1.5 cm) and appearance, fine needle aspiration of
this moderately suspicious nodule should be considered based on
TI-RADS criteria.

_________________________________________________________

There is an approximately 2.9 x 2.8 x 2.7 cm
spongiform/benign-appearing nodule involving the majority of the mid
and inferior aspects of the left lobe of the thyroid (labeled 5),
which does not meet criteria to recommend percutaneous sampling or
continued dedicated follow-up.
IMPRESSION: 1. Findings suggestive of multinodular goiter.
2. Nodule #4 meets imaging criteria to recommend percutaneous
sampling as indicated.
3. Nodule #2 meets imaging criteria to recommend a 1 year follow-up.

The above is in keeping with the ACR TI-RADS recommendations - [HOSPITAL] 9632;[DATE].

## 2022-07-15 DIAGNOSIS — Z0000 Encounter for general adult medical examination without abnormal findings: Secondary | ICD-10-CM | POA: Diagnosis not present

## 2022-07-15 DIAGNOSIS — R7303 Prediabetes: Secondary | ICD-10-CM | POA: Diagnosis not present

## 2022-07-16 ENCOUNTER — Other Ambulatory Visit: Payer: Self-pay | Admitting: Internal Medicine

## 2022-07-16 DIAGNOSIS — E041 Nontoxic single thyroid nodule: Secondary | ICD-10-CM

## 2022-07-22 ENCOUNTER — Other Ambulatory Visit: Payer: BC Managed Care – PPO

## 2022-07-22 DIAGNOSIS — Z0000 Encounter for general adult medical examination without abnormal findings: Secondary | ICD-10-CM | POA: Diagnosis not present

## 2022-07-22 DIAGNOSIS — M792 Neuralgia and neuritis, unspecified: Secondary | ICD-10-CM | POA: Diagnosis not present

## 2022-07-22 DIAGNOSIS — R7303 Prediabetes: Secondary | ICD-10-CM | POA: Diagnosis not present

## 2022-07-22 DIAGNOSIS — E041 Nontoxic single thyroid nodule: Secondary | ICD-10-CM | POA: Diagnosis not present

## 2022-07-22 DIAGNOSIS — Z23 Encounter for immunization: Secondary | ICD-10-CM | POA: Diagnosis not present

## 2022-07-23 ENCOUNTER — Ambulatory Visit
Admission: RE | Admit: 2022-07-23 | Discharge: 2022-07-23 | Disposition: A | Discharge status: Home / Self Care | Payer: BC Managed Care – PPO | Source: Ambulatory Visit | Attending: Internal Medicine | Admitting: Internal Medicine

## 2022-07-23 DIAGNOSIS — E041 Nontoxic single thyroid nodule: Secondary | ICD-10-CM

## 2022-07-31 IMAGING — US US FNA BIOPSY THYROID 1ST LESION
1 series · 13 of 13 positions shown · non-contrast
Comparison: Ultrasound thyroid 07/15/21

MEDICATIONS:
2 mL 1% lidocaine

COMPLICATIONS:
None immediate.

INDICATION: Patient with recently noted palpable left-sided thyroid nodule.
Request to IR for fine-needle aspiration.

EXAM:
ULTRASOUND GUIDED FINE NEEDLE ASPIRATION OF INDETERMINATE THYROID
NODULE
TECHNIQUE: Informed written consent was obtained from the patient after a
discussion of the risks, benefits and alternatives to treatment.
Questions regarding the procedure were encouraged and answered. A
timeout was performed prior to the initiation of the procedure.

[Series 1: us fna biopsy thyroid 1st lesion · 0.05mm/px · 13 acquisitions, 13 frames shown]
[im 1/13]
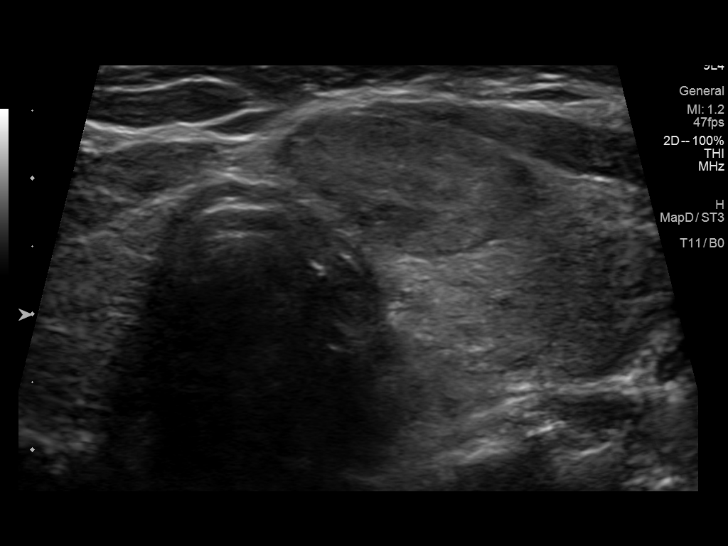
[im 2/13]
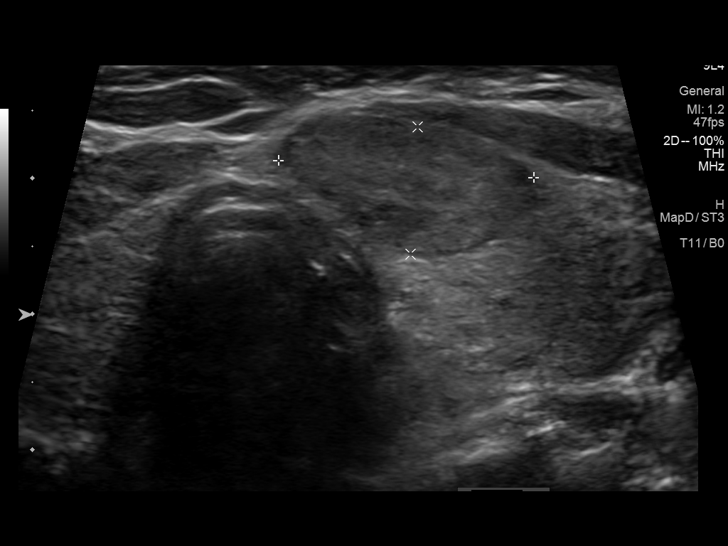
[im 3/13]
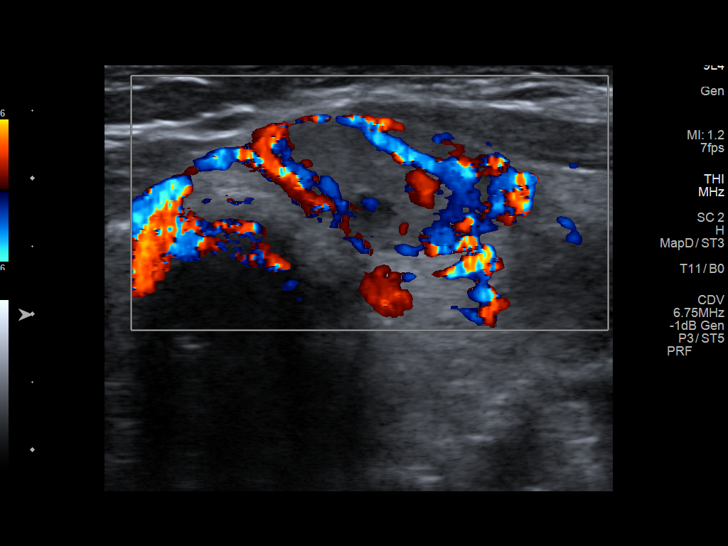
[im 4/13]
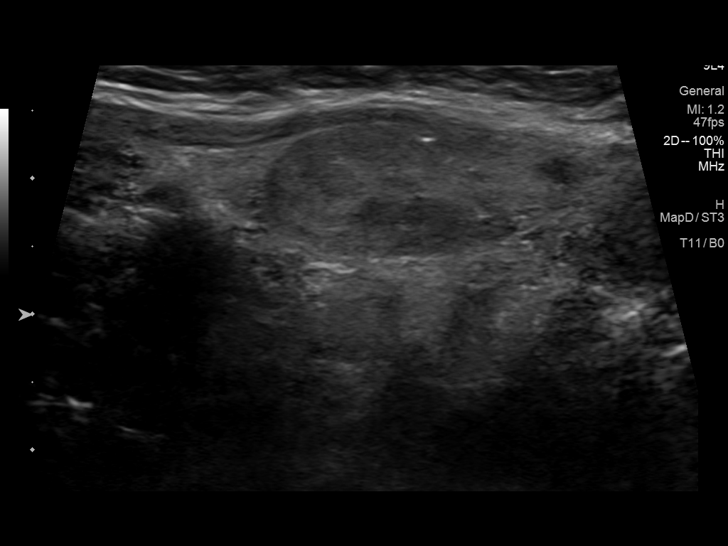
[im 5/13]
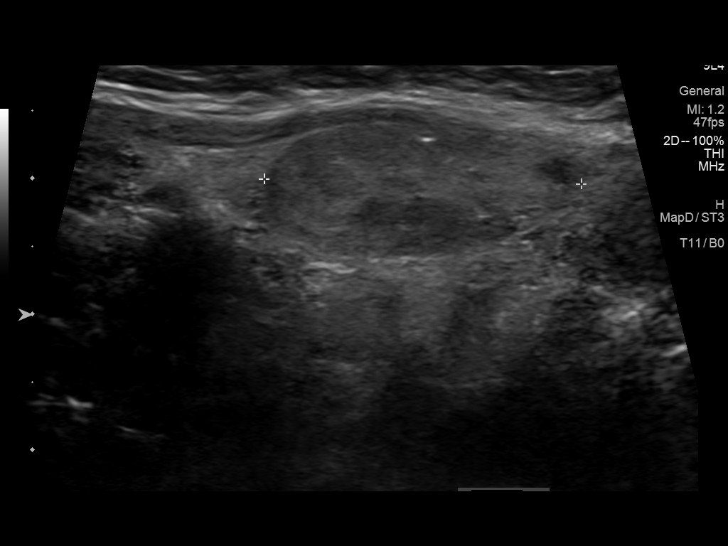
[im 6/13]
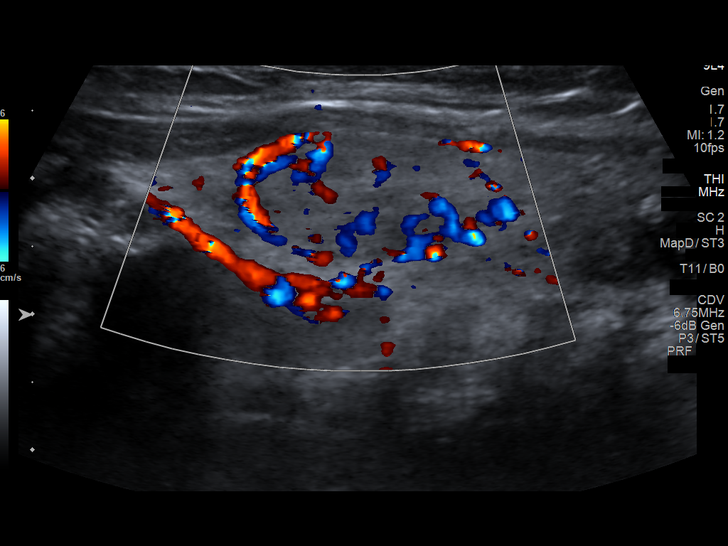
[im 7/13]
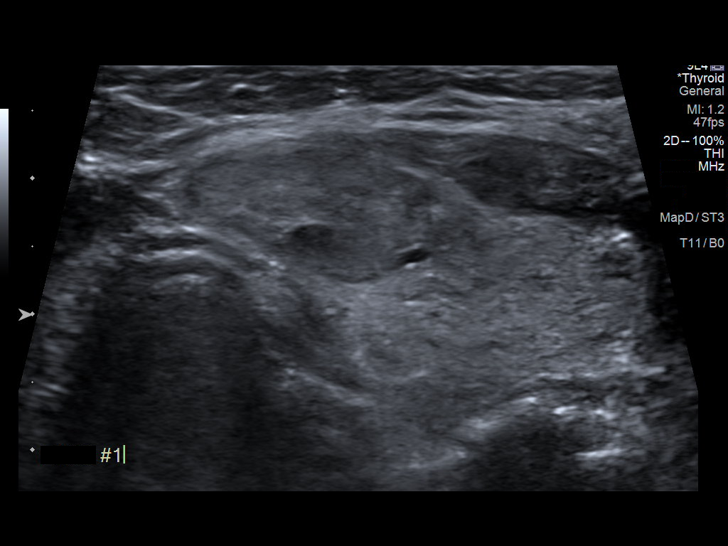
[im 8/13]
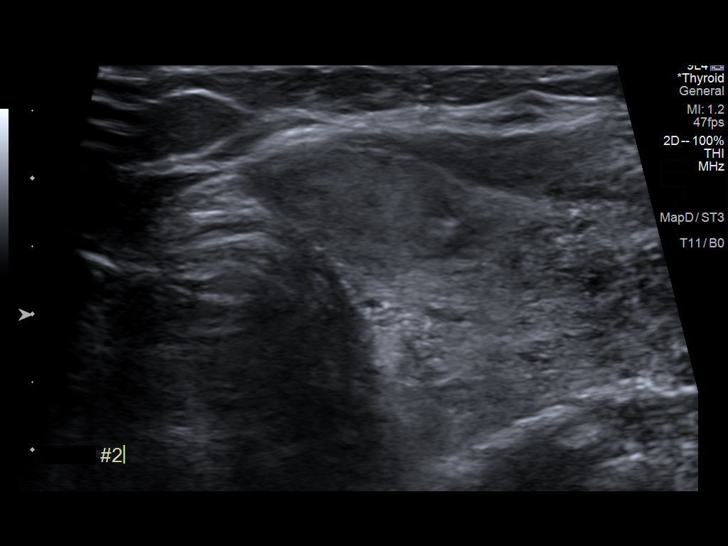
[im 9/13]
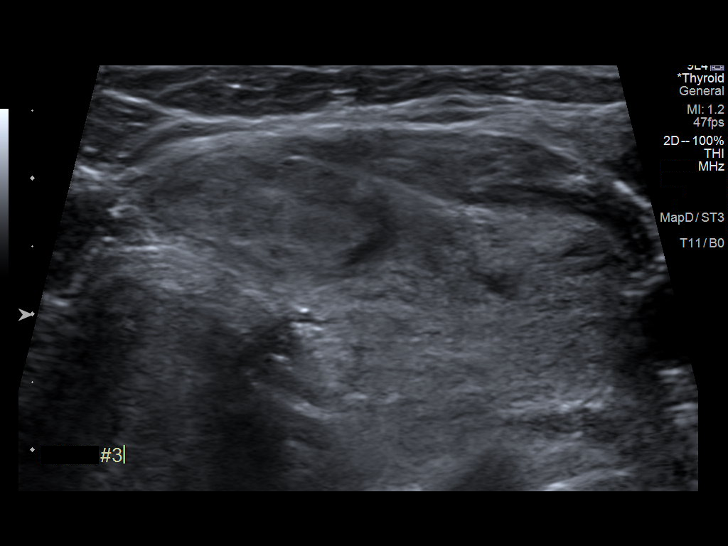
[im 10/13]
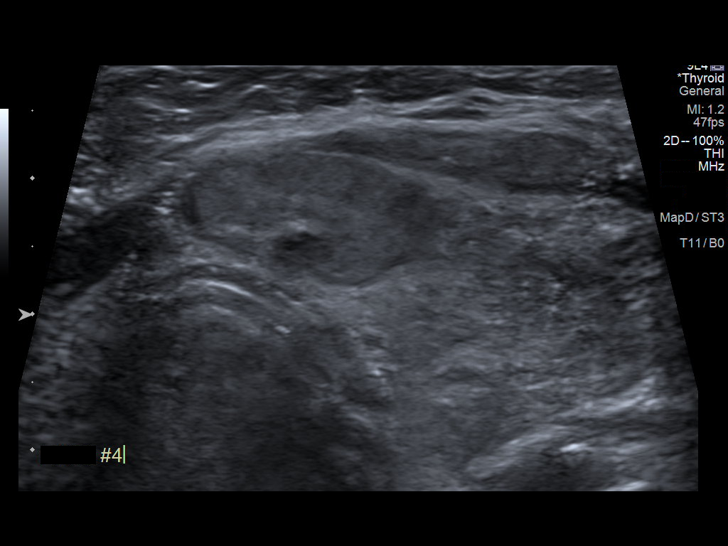
[im 11/13]
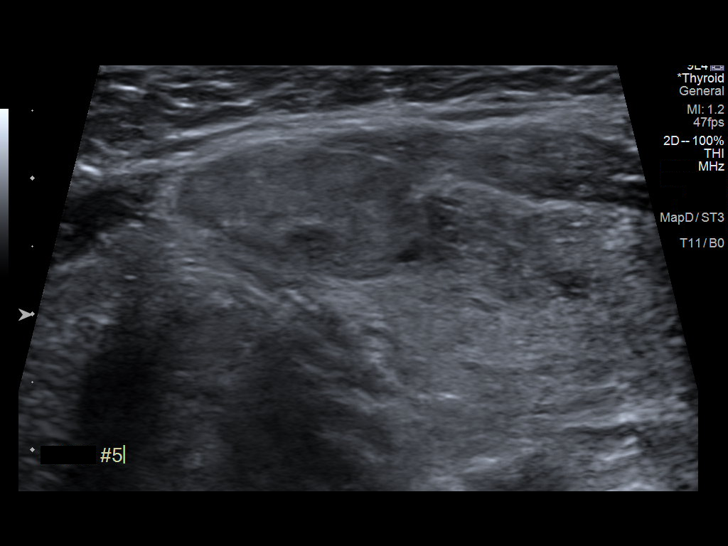
[im 12/13]
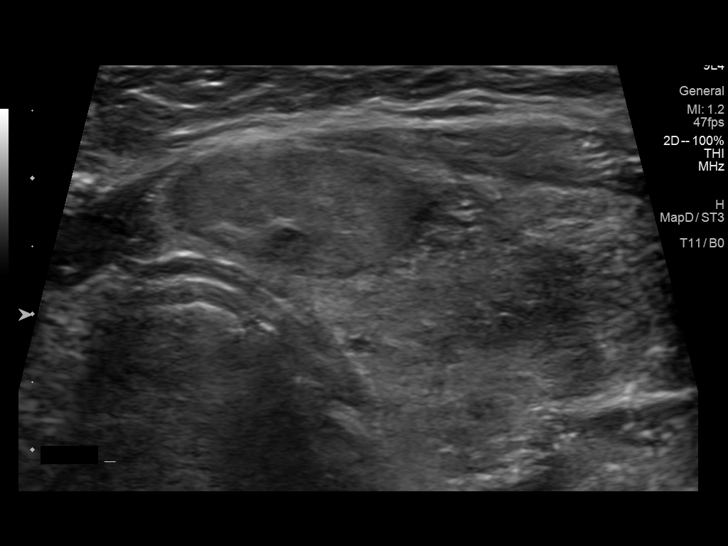
[im 13/13]
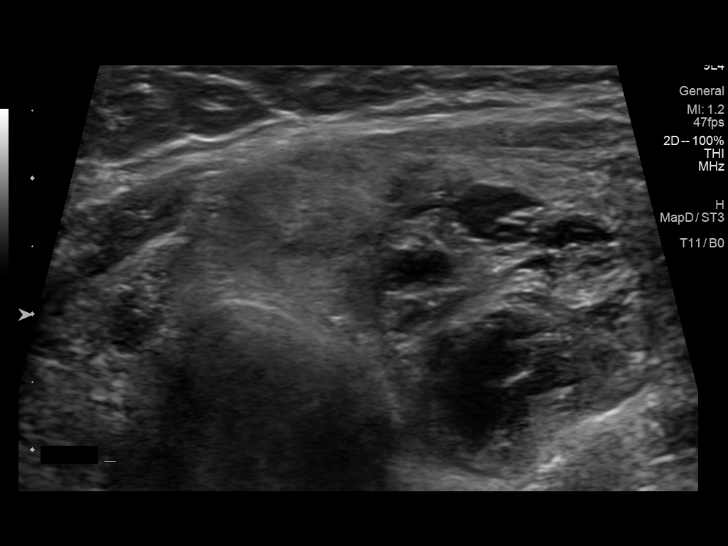

[13 of 13 positions shown; findings below may reference images not displayed]

Pre-procedural ultrasound scanning demonstrated unchanged size and
appearance of the indeterminate nodule within the left mid thyroid
lobe

The procedure was planned. The neck was prepped in the usual sterile
fashion, and a sterile drape was applied covering the operative
field. A timeout was performed prior to the initiation of the
procedure. Local anesthesia was provided with 1% lidocaine.

Under direct ultrasound guidance, 5 FNA biopsies were performed of
the indeterminate left mid thyroid nodule with a 27 gauge needle.
Multiple ultrasound images were saved for procedural documentation
purposes. The samples were prepared and submitted to pathology.

Limited post procedural scanning was negative for hematoma or
additional complication. Dressings were placed. The patient
tolerated the above procedures procedure well without immediate
postprocedural complication.
FINDINGS: Nodule reference number based on prior diagnostic ultrasound: 4

Maximum size: 2.2 cm

Location: Left; Mid

ACR TI-RADS risk category: TR4 (4-6 points)

Reason for biopsy: meets ACR TI-RADS criteria

Ultrasound imaging confirms appropriate placement of the needles
within the thyroid nodule.
IMPRESSION: Technically successful ultrasound guided fine needle aspiration of
indeterminate left mid thyroid nodule.

Read by Paras, Marcellous

## 2022-08-10 ENCOUNTER — Other Ambulatory Visit: Payer: Self-pay | Admitting: Internal Medicine

## 2022-08-10 DIAGNOSIS — E041 Nontoxic single thyroid nodule: Secondary | ICD-10-CM

## 2022-08-19 DIAGNOSIS — G5601 Carpal tunnel syndrome, right upper limb: Secondary | ICD-10-CM | POA: Diagnosis not present

## 2022-08-19 DIAGNOSIS — M542 Cervicalgia: Secondary | ICD-10-CM | POA: Diagnosis not present

## 2022-10-29 DIAGNOSIS — E041 Nontoxic single thyroid nodule: Secondary | ICD-10-CM | POA: Diagnosis not present

## 2022-10-29 DIAGNOSIS — R7303 Prediabetes: Secondary | ICD-10-CM | POA: Diagnosis not present

## 2022-11-30 DIAGNOSIS — Z6830 Body mass index (BMI) 30.0-30.9, adult: Secondary | ICD-10-CM | POA: Diagnosis not present

## 2022-11-30 DIAGNOSIS — Z01419 Encounter for gynecological examination (general) (routine) without abnormal findings: Secondary | ICD-10-CM | POA: Diagnosis not present

## 2022-11-30 DIAGNOSIS — Z1231 Encounter for screening mammogram for malignant neoplasm of breast: Secondary | ICD-10-CM | POA: Diagnosis not present

## 2022-12-17 DIAGNOSIS — L570 Actinic keratosis: Secondary | ICD-10-CM | POA: Diagnosis not present

## 2022-12-17 DIAGNOSIS — L578 Other skin changes due to chronic exposure to nonionizing radiation: Secondary | ICD-10-CM | POA: Diagnosis not present

## 2022-12-23 DIAGNOSIS — R1310 Dysphagia, unspecified: Secondary | ICD-10-CM | POA: Diagnosis not present

## 2022-12-23 DIAGNOSIS — K219 Gastro-esophageal reflux disease without esophagitis: Secondary | ICD-10-CM | POA: Diagnosis not present

## 2022-12-25 DIAGNOSIS — K222 Esophageal obstruction: Secondary | ICD-10-CM | POA: Diagnosis not present

## 2022-12-25 DIAGNOSIS — R1310 Dysphagia, unspecified: Secondary | ICD-10-CM | POA: Diagnosis not present

## 2022-12-25 DIAGNOSIS — K449 Diaphragmatic hernia without obstruction or gangrene: Secondary | ICD-10-CM | POA: Diagnosis not present

## 2022-12-25 DIAGNOSIS — K648 Other hemorrhoids: Secondary | ICD-10-CM | POA: Diagnosis not present

## 2022-12-25 DIAGNOSIS — K2289 Other specified disease of esophagus: Secondary | ICD-10-CM | POA: Diagnosis not present

## 2022-12-25 DIAGNOSIS — Z1211 Encounter for screening for malignant neoplasm of colon: Secondary | ICD-10-CM | POA: Diagnosis not present

## 2022-12-25 DIAGNOSIS — K5730 Diverticulosis of large intestine without perforation or abscess without bleeding: Secondary | ICD-10-CM | POA: Diagnosis not present

## 2023-03-18 DIAGNOSIS — R7303 Prediabetes: Secondary | ICD-10-CM | POA: Diagnosis not present

## 2023-03-18 DIAGNOSIS — E041 Nontoxic single thyroid nodule: Secondary | ICD-10-CM | POA: Diagnosis not present

## 2023-03-25 DIAGNOSIS — K219 Gastro-esophageal reflux disease without esophagitis: Secondary | ICD-10-CM | POA: Diagnosis not present

## 2023-03-25 DIAGNOSIS — R7303 Prediabetes: Secondary | ICD-10-CM | POA: Diagnosis not present

## 2023-03-25 DIAGNOSIS — E669 Obesity, unspecified: Secondary | ICD-10-CM | POA: Diagnosis not present

## 2023-03-25 DIAGNOSIS — E041 Nontoxic single thyroid nodule: Secondary | ICD-10-CM | POA: Diagnosis not present

## 2023-04-01 DIAGNOSIS — K219 Gastro-esophageal reflux disease without esophagitis: Secondary | ICD-10-CM | POA: Diagnosis not present

## 2023-04-01 DIAGNOSIS — Z1211 Encounter for screening for malignant neoplasm of colon: Secondary | ICD-10-CM | POA: Diagnosis not present

## 2023-04-01 DIAGNOSIS — R1310 Dysphagia, unspecified: Secondary | ICD-10-CM | POA: Diagnosis not present

## 2023-06-18 DIAGNOSIS — E669 Obesity, unspecified: Secondary | ICD-10-CM | POA: Diagnosis not present

## 2023-06-18 DIAGNOSIS — Z713 Dietary counseling and surveillance: Secondary | ICD-10-CM | POA: Diagnosis not present

## 2023-07-23 ENCOUNTER — Other Ambulatory Visit: Payer: Self-pay | Admitting: Internal Medicine

## 2023-07-23 DIAGNOSIS — E041 Nontoxic single thyroid nodule: Secondary | ICD-10-CM

## 2023-07-27 DIAGNOSIS — Z713 Dietary counseling and surveillance: Secondary | ICD-10-CM | POA: Diagnosis not present

## 2023-07-27 DIAGNOSIS — Z23 Encounter for immunization: Secondary | ICD-10-CM | POA: Diagnosis not present

## 2023-08-04 DIAGNOSIS — L821 Other seborrheic keratosis: Secondary | ICD-10-CM | POA: Diagnosis not present

## 2023-08-04 DIAGNOSIS — L578 Other skin changes due to chronic exposure to nonionizing radiation: Secondary | ICD-10-CM | POA: Diagnosis not present

## 2023-08-04 DIAGNOSIS — L815 Leukoderma, not elsewhere classified: Secondary | ICD-10-CM | POA: Diagnosis not present

## 2023-08-04 DIAGNOSIS — L218 Other seborrheic dermatitis: Secondary | ICD-10-CM | POA: Diagnosis not present

## 2023-08-26 DIAGNOSIS — M792 Neuralgia and neuritis, unspecified: Secondary | ICD-10-CM | POA: Diagnosis not present

## 2023-08-26 DIAGNOSIS — E041 Nontoxic single thyroid nodule: Secondary | ICD-10-CM | POA: Diagnosis not present

## 2023-08-26 DIAGNOSIS — R7303 Prediabetes: Secondary | ICD-10-CM | POA: Diagnosis not present

## 2023-08-26 DIAGNOSIS — Z0000 Encounter for general adult medical examination without abnormal findings: Secondary | ICD-10-CM | POA: Diagnosis not present

## 2023-09-02 DIAGNOSIS — R7303 Prediabetes: Secondary | ICD-10-CM | POA: Diagnosis not present

## 2023-09-02 DIAGNOSIS — Z0000 Encounter for general adult medical examination without abnormal findings: Secondary | ICD-10-CM | POA: Diagnosis not present

## 2023-09-02 DIAGNOSIS — E669 Obesity, unspecified: Secondary | ICD-10-CM | POA: Diagnosis not present

## 2023-09-02 DIAGNOSIS — J4520 Mild intermittent asthma, uncomplicated: Secondary | ICD-10-CM | POA: Diagnosis not present

## 2023-12-22 DIAGNOSIS — Z1231 Encounter for screening mammogram for malignant neoplasm of breast: Secondary | ICD-10-CM | POA: Diagnosis not present

## 2023-12-22 DIAGNOSIS — Z01419 Encounter for gynecological examination (general) (routine) without abnormal findings: Secondary | ICD-10-CM | POA: Diagnosis not present

## 2023-12-22 DIAGNOSIS — Z1331 Encounter for screening for depression: Secondary | ICD-10-CM | POA: Diagnosis not present
